# Patient Record
Sex: Male | Born: 1965
Health system: Southern US, Community
[De-identification: ages and names within clinical notes are randomized; demographics above are authoritative.]

## PROBLEM LIST (undated history)

## (undated) DIAGNOSIS — I808 Phlebitis and thrombophlebitis of other sites: Secondary | ICD-10-CM

## (undated) DIAGNOSIS — K529 Noninfective gastroenteritis and colitis, unspecified: Secondary | ICD-10-CM

## (undated) HISTORY — PX: SHOULDER SURGERY: SHX246

## (undated) HISTORY — PX: CHOLECYSTECTOMY: SHX55

---

## 2009-01-19 ENCOUNTER — Ambulatory Visit: Payer: Self-pay | Admitting: Diagnostic Radiology

## 2009-01-19 ENCOUNTER — Emergency Department (HOSPITAL_BASED_OUTPATIENT_CLINIC_OR_DEPARTMENT_OTHER): Admission: EM | Admit: 2009-01-19 | Discharge: 2009-01-19 | Payer: Self-pay | Admitting: Emergency Medicine

## 2010-08-23 LAB — COMPREHENSIVE METABOLIC PANEL
Albumin: 4.7 g/dL (ref 3.5–5.2)
Alkaline Phosphatase: 85 U/L (ref 39–117)
BUN: 15 mg/dL (ref 6–23)
Chloride: 107 mEq/L (ref 96–112)
Creatinine, Ser: 1.3 mg/dL (ref 0.4–1.5)
GFR calc non Af Amer: 60 mL/min — ABNORMAL LOW (ref >60)
Glucose, Bld: 90 mg/dL (ref 70–99)
Potassium: 4.1 mEq/L (ref 3.5–5.1)
Total Bilirubin: 0.3 mg/dL (ref 0.3–1.2)

## 2010-08-23 LAB — URINALYSIS, ROUTINE W REFLEX MICROSCOPIC
Hgb urine dipstick: NEGATIVE
Nitrite: NEGATIVE
Protein, ur: NEGATIVE mg/dL
Specific Gravity, Urine: 1.021 (ref 1.005–1.030)
Urobilinogen, UA: 0.2 mg/dL (ref 0.0–1.0)

## 2010-08-23 LAB — CBC
HCT: 47 % (ref 39.0–52.0)
Hemoglobin: 16.1 g/dL (ref 13.0–17.0)
MCHC: 34.3 g/dL (ref 30.0–36.0)
MCV: 87.1 fL (ref 78.0–100.0)
Platelets: 308 10*3/uL (ref 150–400)
RBC: 5.4 MIL/uL (ref 4.22–5.81)
RDW: 12.6 % (ref 11.5–15.5)
WBC: 9.3 10*3/uL (ref 4.0–10.5)

## 2010-08-23 LAB — DIFFERENTIAL
Basophils Absolute: 0.1 10*3/uL (ref 0.0–0.1)
Basophils Relative: 1 % (ref 0–1)
Lymphocytes Relative: 27 % (ref 12–46)
Neutro Abs: 5.7 10*3/uL (ref 1.7–7.7)
Neutrophils Relative %: 62 % (ref 43–77)

## 2010-08-23 LAB — LIPASE, BLOOD: Lipase: 193 U/L (ref 23–300)

## 2011-08-29 ENCOUNTER — Encounter (HOSPITAL_BASED_OUTPATIENT_CLINIC_OR_DEPARTMENT_OTHER): Payer: Self-pay | Admitting: *Deleted

## 2011-08-29 ENCOUNTER — Emergency Department (HOSPITAL_BASED_OUTPATIENT_CLINIC_OR_DEPARTMENT_OTHER): Payer: Self-pay

## 2011-08-29 ENCOUNTER — Emergency Department (HOSPITAL_BASED_OUTPATIENT_CLINIC_OR_DEPARTMENT_OTHER)
Admission: EM | Admit: 2011-08-29 | Discharge: 2011-08-29 | Disposition: A | Discharge status: Home / Self Care | Payer: Self-pay | Attending: Emergency Medicine | Admitting: Emergency Medicine

## 2011-08-29 ENCOUNTER — Emergency Department (INDEPENDENT_AMBULATORY_CARE_PROVIDER_SITE_OTHER): Payer: Self-pay

## 2011-08-29 DIAGNOSIS — R05 Cough: Secondary | ICD-10-CM

## 2011-08-29 DIAGNOSIS — J45909 Unspecified asthma, uncomplicated: Secondary | ICD-10-CM | POA: Insufficient documentation

## 2011-08-29 DIAGNOSIS — R0602 Shortness of breath: Secondary | ICD-10-CM | POA: Insufficient documentation

## 2011-08-29 DIAGNOSIS — R079 Chest pain, unspecified: Secondary | ICD-10-CM

## 2011-08-29 DIAGNOSIS — J189 Pneumonia, unspecified organism: Secondary | ICD-10-CM | POA: Insufficient documentation

## 2011-08-29 DIAGNOSIS — R9431 Abnormal electrocardiogram [ECG] [EKG]: Secondary | ICD-10-CM | POA: Insufficient documentation

## 2011-08-29 DIAGNOSIS — F17200 Nicotine dependence, unspecified, uncomplicated: Secondary | ICD-10-CM | POA: Insufficient documentation

## 2011-08-29 DIAGNOSIS — R918 Other nonspecific abnormal finding of lung field: Secondary | ICD-10-CM

## 2011-08-29 MED ORDER — ALBUTEROL SULFATE 2 MG PO TABS: 2.0000 mg | ORAL_TABLET | Freq: Three times a day (TID) | ORAL | Status: DC

## 2011-08-29 MED ORDER — BENZONATATE 100 MG PO CAPS
100.0000 mg | ORAL_CAPSULE | Freq: Once | ORAL | Status: AC
  Administered 2011-08-29: 100 mg via ORAL
  Filled 2011-08-29: qty 1

## 2011-08-29 MED ORDER — BENZONATATE 100 MG PO CAPS: 100.0000 mg | ORAL_CAPSULE | Freq: Three times a day (TID) | ORAL | Status: AC

## 2011-08-29 MED ORDER — IPRATROPIUM BROMIDE 0.02 % IN SOLN
RESPIRATORY_TRACT | Status: AC
  Filled 2011-08-29: qty 2.5

## 2011-08-29 MED ORDER — ALBUTEROL SULFATE (5 MG/ML) 0.5% IN NEBU
INHALATION_SOLUTION | RESPIRATORY_TRACT | Status: AC
  Filled 2011-08-29: qty 1

## 2011-08-29 MED ORDER — ALBUTEROL SULFATE HFA 108 (90 BASE) MCG/ACT IN AERS
1.0000 | INHALATION_SPRAY | Freq: Once | RESPIRATORY_TRACT | Status: AC
  Administered 2011-08-29: 2 via RESPIRATORY_TRACT
  Filled 2011-08-29: qty 6.7

## 2011-08-29 MED ORDER — AZITHROMYCIN 250 MG PO TABS: 250.0000 mg | ORAL_TABLET | Freq: Every day | ORAL | Status: AC

## 2011-08-29 MED ORDER — ALBUTEROL SULFATE (5 MG/ML) 0.5% IN NEBU
5.0000 mg | INHALATION_SOLUTION | Freq: Once | RESPIRATORY_TRACT | Status: AC
  Administered 2011-08-29: 5 mg via RESPIRATORY_TRACT

## 2011-08-29 MED ORDER — IPRATROPIUM BROMIDE 0.02 % IN SOLN
0.5000 mg | Freq: Once | RESPIRATORY_TRACT | Status: AC
  Administered 2011-08-29: 0.5 mg via RESPIRATORY_TRACT

## 2011-08-29 NOTE — Discharge Instructions (Signed)
Pneumonia, Adult Pneumonia is an infection of the lungs.  CAUSES Pneumonia may be caused by bacteria or a virus. Usually, these infections are caused by breathing infectious particles into the lungs (respiratory tract). SYMPTOMS   Cough.   Fever.   Chest pain.   Increased rate of breathing.   Wheezing.   Mucus production.  DIAGNOSIS  If you have the common symptoms of pneumonia, your caregiver will typically confirm the diagnosis with a chest X-ray. The X-ray will show an abnormality in the lung (pulmonary infiltrate) if you have pneumonia. Other tests of your blood, urine, or sputum may be done to find the specific cause of your pneumonia. Your caregiver may also do tests (blood gases or pulse oximetry) to see how well your lungs are working. TREATMENT  Some forms of pneumonia may be spread to other people when you cough or sneeze. You may be asked to wear a mask before and during your exam. Pneumonia that is caused by bacteria is treated with antibiotic medicine. Pneumonia that is caused by the influenza virus may be treated with an antiviral medicine. Most other viral infections must run their course. These infections will not respond to antibiotics.  PREVENTION A pneumococcal shot (vaccine) is available to prevent a common bacterial cause of pneumonia. This is usually suggested for:  People over 65 years old.   Patients on chemotherapy.   People with chronic lung problems, such as bronchitis or emphysema.   People with immune system problems.  If you are over 65 or have a high risk condition, you may receive the pneumococcal vaccine if you have not received it before. In some countries, a routine influenza vaccine is also recommended. This vaccine can help prevent some cases of pneumonia.You may be offered the influenza vaccine as part of your care. If you smoke, it is time to quit. You may receive instructions on how to stop smoking. Your caregiver can provide medicines and  counseling to help you quit. HOME CARE INSTRUCTIONS   Cough suppressants may be used if you are losing too much rest. However, coughing protects you by clearing your lungs. You should avoid using cough suppressants if you can.   Your caregiver may have prescribed medicine if he or she thinks your pneumonia is caused by a bacteria or influenza. Finish your medicine even if you start to feel better.   Your caregiver may also prescribe an expectorant. This loosens the mucus to be coughed up.   Only take over-the-counter or prescription medicines for pain, discomfort, or fever as directed by your caregiver.   Do not smoke. Smoking is a common cause of bronchitis and can contribute to pneumonia. If you are a smoker and continue to smoke, your cough may last several weeks after your pneumonia has cleared.   A cold steam vaporizer or humidifier in your room or home may help loosen mucus.   Coughing is often worse at night. Sleeping in a semi-upright position in a recliner or using a couple pillows under your head will help with this.   Get rest as you feel it is needed. Your body will usually let you know when you need to rest.  SEEK IMMEDIATE MEDICAL CARE IF:   Your illness becomes worse. This is especially true if you are elderly or weakened from any other disease.   You cannot control your cough with suppressants and are losing sleep.   You begin coughing up blood.   You develop pain which is getting worse or   is uncontrolled with medicines.   You have a fever.   Any of the symptoms which initially brought you in for treatment are getting worse rather than better.   You develop shortness of breath or chest pain.  MAKE SURE YOU:   Understand these instructions.   Will watch your condition.   Will get help right away if you are not doing well or get worse.  Document Released: 05/05/2005 Document Revised: 04/24/2011 Document Reviewed: 07/25/2010 [facility] Patient Information 2012  [place], [place].  Asthma, Adult Asthma is caused by narrowing of the air passages in the lungs. It may be triggered by pollen, dust, animal dander, molds, some foods, respiratory infections, exposure to smoke, exercise, emotional stress or other allergens (things that cause allergic reactions or allergies). Repeat attacks are common. HOME CARE INSTRUCTIONS   Use prescription medications as ordered by your caregiver.   Avoid pollen, dust, animal dander, molds, smoke and other things that cause attacks at home and at work.   You may have fewer attacks if you decrease dust in your home. Electrostatic air cleaners may help.   It may help to replace your pillows or mattress with materials less likely to cause allergies.   Talk to your caregiver about an action plan for managing asthma attacks at home, including, the use of a peak flow meter which measures the severity of your asthma attack. An action plan can help minimize or stop the attack without having to seek medical care.   If you are not on a fluid restriction, drink 8 to 10 glasses of water each day.   Always have a plan prepared for seeking medical attention, including, calling your physician, accessing local emergency care, and calling 911 (in the [place] for a severe attack.   Discuss possible exercise routines with your caregiver.   If animal dander is the cause of asthma, you may need to get rid of pets.  SEEK MEDICAL CARE IF:   You have wheezing and shortness of breath even if taking medicine to prevent attacks.   You have muscle aches, chest pain or thickening of sputum.   Your sputum changes from clear or white to yellow, green, gray, or bloody.   You have any problems that may be related to the medicine you are taking (such as a rash, itching, swelling or trouble breathing).  SEEK IMMEDIATE MEDICAL CARE IF:   Your usual medicines do not stop your wheezing or there is increased coughing and/or shortness of breath.   You  have increased difficulty breathing.   You have a fever.  MAKE SURE YOU:   Understand these instructions.   Will watch your condition.   Will get help right away if you are not doing well or get worse.  Document Released: 05/05/2005 Document Revised: 04/24/2011 Document Reviewed: 12/22/2007 [facility] Patient Information 2012 [place], [place].  RESOURCE GUIDE  Dental Problems  Patients with Medicaid: [facility] [phone]                                           [phone] [address] Phone:  [phone]  Phone:  [phone]  If unable to pay or uninsured, contact:  [facility] or [facility]. to become qualified for the adult dental clinic.  Chronic Pain Problems Contact [facility]  [phone] Patients need to be referred by their primary care doctor.  Insufficient Money for Medicine Contact [employer] Way:  call "211" or [facility] [phone].  No Primary Care Doctor Call [facility]  [phone] Other agencies that provide inexpensive medical care    [facility]  [phone]    [facility]  [phone]    [facility]  [phone]    [facility]  [phone]    [facility]  [phone]    [facility]  [phone]  Psychological Services [facility]  [phone] [facility]  [phone] [facility]   [phone] (emergency services [phone])  Abuse/Neglect [facility] Child Abuse Hotline [phone] [facility] Child Abuse Hotline 364-635-8427 (After Hours)  Emergency Shelter [facility] [phone]  [facility] [place] [facility] [place] [phone] [facility] [phone]  MRSA Hotline #:   856-567-8992    [facility] Resources  [facility] [place]  [employer]                            [facility] Dept. [address] [place]                     [address]         [address]  Blondell Reveal Phone:  [phone]                                  Phone:  [phone]                   Phone:  [phone]  [facility] Phone:  [phone]  [facility] Child Abuse Hotline 438-123-7431 [phone] (After Hours)

## 2011-08-29 NOTE — ED Provider Notes (Signed)
History     CSN: [id number]  Arrival date & time 08/29/11  1952   First MD Initiated Contact with Patient 08/29/11 2137      Chief Complaint  Patient presents with  . Shortness of Breath    (Consider location/radiation/quality/duration/timing/severity/associated sxs/prior treatment) HPI  46yoM h/o asthma, smoker,  pw shortness of breath. States for 1.5 weeks he's felt more sob than usual, worse 3-4 days ago. Reports fever to 104 at home today. Has been taking motrin 800mg  last dose 630PM. +Cough, mostly non productive. Denies sore throat, nasal congestion, rhinorrhea. States he ran out of his albuterol 9 months ago. Complaining of left chest pain "felt like a spasm when I was coughing" from left chest to clavicle. States it was constant but resolved. Resolved after neb given in the Emergency Department.  Denies h/o VTE in self or family. No recent hosp/surg/immob-- does drive to [place] to/fro once per week for his job. No h/o cancer. Denies exogenous hormone use, no leg pain or swelling.   Denies HTN, HLD, DM, +smoker fmhx- denies h/o CAD  ED Notes, ED Provider Notes from 08/29/11 0000 to 08/29/11 20:00:00       Julieanne Manson, RN 08/29/2011 19:55      Sob, chest pain, fever, for about a week. States he has a hx of asthma and ran out of albuterol 9 months ago. Hyperventilating at triage. Lungs are clear. Very anxious.      Past Medical History  Diagnosis Date  . Asthma     Past Surgical History  Procedure Date  . Cholecystectomy   . Shoulder surgery     No family history on file.  History  Substance Use Topics  . Smoking status: Current Everyday Smoker -- 1.0 packs/day  . Smokeless tobacco: Not on file  . Alcohol Use: No      Review of Systems  All other systems reviewed and are negative.  except as noted HPI   Allergies  Claritin and Penicillins  Home Medications   Current Outpatient Rx  Name Route Sig Dispense Refill  . ALBUTEROL SULFATE HFA 108 (90  BASE) MCG/ACT IN AERS Inhalation Inhale 2 puffs into the lungs every 6 (six) hours as needed. Patient used this medication for shortness of breath.    . TAGAMET PO Oral Take 2 tablets by mouth daily as needed. Patient used this medication for acid reflux.    . IBUPROFEN 200 MG PO TABS Oral Take 800 mg by mouth every 6 (six) hours as needed. Patient used this medication for pain.    . ALBUTEROL SULFATE 2 MG PO TABS Oral Take 1 tablet (2 mg total) by mouth 3 (three) times daily. 90 tablet 0  . AZITHROMYCIN 250 MG PO TABS Oral Take 1 tablet (250 mg total) by mouth daily. Take first 2 tablets together, then 1 every day until finished. 6 tablet 0  . BENZONATATE 100 MG PO CAPS Oral Take 1 capsule (100 mg total) by mouth every 8 (eight) hours. 21 capsule 0    BP 109/65  Pulse 109  Temp(Src) 99.7 F (37.6 C) (Oral)  Resp 18  SpO2 99%  Physical Exam  Nursing note and vitals reviewed. Constitutional: He is oriented to person, place, and time. He appears well-developed and well-nourished. No distress.  HENT:  Head: Atraumatic.  Mouth/Throat: Oropharynx is clear and moist.  Eyes: Conjunctivae are normal. Pupils are equal, round, and reactive to light.  Neck: Neck supple.  Cardiovascular: Normal rate, regular rhythm, normal  heart sounds and intact distal pulses.  Exam reveals no gallop and no friction rub.   No murmur heard. Pulmonary/Chest: Effort normal. No respiratory distress. He has no wheezes. He has no rales.  Abdominal: Soft. Bowel sounds are normal. There is no tenderness. There is no rebound and no guarding.  Musculoskeletal: Normal range of motion. He exhibits no edema and no tenderness.  Neurological: He is alert and oriented to person, place, and time.  Skin: Skin is warm and dry.  Psychiatric: He has a normal mood and affect.    Date: 08/29/2011  Rate: 108  Rhythm: sinus tachycardia  QRS Axis: normal  Intervals: normal  ST/T Wave abnormalities: nonspecific ST changes and  nonspecific T wave changes  Conduction Disutrbances:none  Narrative Interpretation:   Old EKG Reviewed: none available   Date: 08/29/2011  Rate: 82  Rhythm: normal sinus rhythm  QRS Axis: normal  Intervals: normal  ST/T Wave abnormalities: normal  Conduction Disutrbances:none  Narrative Interpretation:   Old EKG Reviewed: none available   ED Course  Procedures (including critical care time)  Labs Reviewed - No data to display Dg Chest 2 View  08/29/2011  *RADIOLOGY REPORT*  Clinical Data: Cough, chest pain and shortness of breath.  CHEST - 2 VIEW  Comparison: 01/19/2009  Findings: The cardiomediastinal silhouette is unremarkable. Left lower lobe consolidation is compatible with pneumonia. There is no evidence of pleural effusion, pneumothorax, nodules/discrete mass or pulmonary edema. No acute bony abnormalities are identified.  IMPRESSION: Left lower lobe consolidation compatible with pneumonia. Radiographic follow up to resolution is recommended.  Original Report Authenticated By: Rosendo Gros, M.D.    1. Community acquired pneumonia   2. Abnormal EKG   3. Asthma     MDM  PW fever, wheezing, shortness of breath. Feeling better after duoneb. Afebrile here (took advil pta). Found to have LLL pneumonia by CXR. Oxygenation 100%RA, no tachypnea. No wheezing after duoneb x one. I have low susp pulm infarct/PE. EKG with t wave inversions inf leads when tachycardic on arrival (since resolved). Resolved with HR control (80s prior to discharge). Discussed extensively with patient. Pain does not sound cardiac in nature. He is not having CP currently and only one RF for CAD (smoker). They are declining labs (suspect 2/2 self pay status). Patient given PMD referrals, albuterol inh in ED, albuterol PO for home, azithromycin. Given strict precautions for return.   HR 80s prior to discharge.    Forbes Cellar, MD 08/30/11 [phone]

## 2011-08-29 NOTE — ED Notes (Signed)
Sob, chest pain, fever, for about a week. States he has a hx of asthma and ran out of albuterol 9 months ago. Hyperventilating at triage. Lungs are clear. Very anxious.

## 2014-03-26 ENCOUNTER — Emergency Department (HOSPITAL_BASED_OUTPATIENT_CLINIC_OR_DEPARTMENT_OTHER)
Admission: EM | Admit: 2014-03-26 | Discharge: 2014-03-26 | Disposition: A | Discharge status: Home / Self Care | Payer: 59 | Attending: Emergency Medicine | Admitting: Emergency Medicine

## 2014-03-26 DIAGNOSIS — H10213 Acute toxic conjunctivitis, bilateral: Secondary | ICD-10-CM | POA: Insufficient documentation

## 2014-03-26 DIAGNOSIS — Z880 Allergy status to penicillin: Secondary | ICD-10-CM | POA: Insufficient documentation

## 2014-03-26 DIAGNOSIS — Z720 Tobacco use: Secondary | ICD-10-CM | POA: Insufficient documentation

## 2014-03-26 DIAGNOSIS — T520X1A Toxic effect of petroleum products, accidental (unintentional), initial encounter: Secondary | ICD-10-CM | POA: Diagnosis not present

## 2014-03-26 DIAGNOSIS — Y9289 Other specified places as the place of occurrence of the external cause: Secondary | ICD-10-CM | POA: Insufficient documentation

## 2014-03-26 DIAGNOSIS — J45909 Unspecified asthma, uncomplicated: Secondary | ICD-10-CM | POA: Insufficient documentation

## 2014-03-26 DIAGNOSIS — H61891 Other specified disorders of right external ear: Secondary | ICD-10-CM | POA: Insufficient documentation

## 2014-03-26 DIAGNOSIS — H5713 Ocular pain, bilateral: Secondary | ICD-10-CM | POA: Diagnosis present

## 2014-03-26 DIAGNOSIS — Y9389 Activity, other specified: Secondary | ICD-10-CM | POA: Insufficient documentation

## 2014-03-26 DIAGNOSIS — H9201 Otalgia, right ear: Secondary | ICD-10-CM | POA: Diagnosis not present

## 2014-03-26 DIAGNOSIS — Z79899 Other long term (current) drug therapy: Secondary | ICD-10-CM | POA: Insufficient documentation

## 2014-03-26 MED ORDER — HYDROCODONE-ACETAMINOPHEN 5-325 MG PO TABS: 2.0000 | ORAL_TABLET | ORAL | Status: DC | PRN

## 2014-03-26 MED ORDER — FLUORESCEIN SODIUM 1 MG OP STRP
1.0000 | ORAL_STRIP | Freq: Once | OPHTHALMIC | Status: AC
  Administered 2014-03-26: 1 via OPHTHALMIC
  Filled 2014-03-26: qty 1

## 2014-03-26 MED ORDER — ERYTHROMYCIN 5 MG/GM OP OINT: TOPICAL_OINTMENT | OPHTHALMIC | Status: DC

## 2014-03-26 MED ORDER — NEOMYCIN-POLYMYXIN-HC 3.5-10000-1 OT SUSP: 4.0000 [drp] | Freq: Four times a day (QID) | OTIC | Status: DC

## 2014-03-26 NOTE — ED Notes (Signed)
Patient working on a car and the [employer] line broke. States that gasoline got into his eyes and ears, can also taste it as well.

## 2014-03-26 NOTE — Discharge Instructions (Signed)
Chemical Conjunctivitis Chemical conjunctivitis is an irritation of the underside of the eyelid and the white part of the eye. Conjunctivitis can be caused by infection, allergy or chemical irritation. In your case it has been caused by a chemical irritation of the eye. Symptoms almost always include: tearing, light sensitivity, gritty feeling (sensation) in the eyes, swelling of your eyelids, and often severe pain. In spite of the severe pain, this irritation will run its course and will improve within 24 hours.  HOME CARE INSTRUCTIONS   To ease discomfort apply a cool, clean wash cloth to your eye for 10 to 20 minutes, 3 to 4 times per day.  Do not rub your eyes.  Gently wipe away any discharge from the eyes with moistened tissues.  Wash your hands often with soap and use paper towels to dry.  Sunglasses may be helpful if light bothers your eyes.  Do not use eye make-up.  Do not use contact lenses until the irritation is gone.  Do not operate machinery or drive if your vision is blurred.  Take medications as directed by your caregiver. Artificial tears may ease discomfort.  Avoid the chemical or surroundings which caused the problem. Always use eye protection as necessary. SEEK MEDICAL CARE IF:   The eye is still pink (inflamed) 3 days after beginning treatment.  Pain in the eye increases.  You have discharge coming from either eye.  Your eyelids are stuck together in the morning.  You have an increased sensitivity to light.  An oral temperature above 102 F (38.9 C) develops.  You develop facial pain.  You have any problems that may be related to the medicine you are taking. SEEK IMMEDIATE MEDICAL CARE IF:   Your vision is getting worse.  You develop severe eye pain. MAKE SURE YOU:   Understand these instructions.  Will watch your condition.  Will get help right away if you are not doing well or get worse. Document Released: 02/12/2005 Document Revised:  07/28/2011 Document Reviewed: 12/22/2007 [employer] Patient Information 2015 [employer]. This information is not intended to replace advice given to you by your health care provider. Make sure you discuss any questions you have with your health care provider.  

## 2014-03-26 NOTE — ED Provider Notes (Signed)
CSN: [id number]     Arrival date & time 03/26/14  1817 History   First MD Initiated Contact with Patient 03/26/14 1820     Chief Complaint  Patient presents with  . Chemical Exposure     (Consider location/radiation/quality/duration/timing/severity/associated sxs/prior Treatment) HPI Comments: Patient presents to the ER for evaluation of gasoline exposure. Patient reports that he was working on a car and the fuel line broke. Patient reports that the gas port down onto his face and dry and his eyes and ears. It did not go in his mouth. Patient reports that he started to have burning and irritation of both of his eyes and the right ear. complaining of moderate pain in the right ear.He washed his face and hair in the shower and then irrigated his ear and eyes with saline prior to coming to the ER. Patient reports that he has a headache, can still smell of gasoline and things that he can taste it. He is concerned that it might have gone through the eustachian tube into his mouth, but did not go directly in the mouth.   Past Medical History  Diagnosis Date  . Asthma    Past Surgical History  Procedure Laterality Date  . Cholecystectomy    . Shoulder surgery     No family history on file. History  Substance Use Topics  . Smoking status: Current Every Day Smoker -- 1.00 packs/day  . Smokeless tobacco: Not on file  . Alcohol Use: No    Review of Systems  HENT: Positive for ear pain.   Eyes: Positive for pain.  Respiratory: Negative for shortness of breath.   All other systems reviewed and are negative.     Allergies  Loratadine and Penicillins  Home Medications   Prior to Admission medications   Medication Sig Start Date End Date Taking? Authorizing Provider  BIOGAIA PROBIOTIC (BIOGAIA PROBIOTIC) LIQD Take by mouth daily at 8 pm.   Yes Historical Provider, MD  albuterol (PROVENTIL HFA) 108 (90 BASE) MCG/ACT inhaler Inhale 2 puffs into the lungs every 6 (six) hours as needed.  Patient used this medication for shortness of breath.    Historical Provider, MD  albuterol (PROVENTIL) 2 MG tablet Take 1 tablet (2 mg total) by mouth 3 (three) times daily. 08/29/11 08/28/12  Forbes CellarLeigh-Ann Webb, MD  Cimetidine (TAGAMET PO) Take 2 tablets by mouth daily as needed. Patient used this medication for acid reflux.    Historical Provider, MD  ibuprofen (ADVIL,MOTRIN) 200 MG tablet Take 800 mg by mouth every 6 (six) hours as needed. Patient used this medication for pain.    Historical Provider, MD   BP 153/102 mmHg  Pulse 90  Temp(Src) 98.6 F (37 C) (Oral)  Resp 20  SpO2 100% Physical Exam  Constitutional: He is oriented to person, place, and time. He appears well-developed and well-nourished. No distress.  HENT:  Head: Normocephalic and atraumatic.  Right Ear: Hearing and tympanic membrane normal. There is swelling (and erythema of canal). No drainage.  Left Ear: Hearing, tympanic membrane and ear canal normal.  Nose: Nose normal.  Mouth/Throat: Oropharynx is clear and moist and mucous membranes are normal.  Eyes: EOM are normal. Pupils are equal, round, and reactive to light. Right conjunctiva is injected. Left conjunctiva is injected.  Mild injection bilaterally, no drainage.  Fluorescein dye with Woods lamp exam reveals normal cornea, no uptake  Neck: Normal range of motion. Neck supple.  Cardiovascular: Regular rhythm, S1 normal and S2 normal.  Exam reveals  no gallop and no friction rub.   No murmur heard. Pulmonary/Chest: Effort normal and breath sounds normal. No respiratory distress. He exhibits no tenderness.  Abdominal: Soft. Normal appearance and bowel sounds are normal. There is no hepatosplenomegaly. There is no tenderness. There is no rebound, no guarding, no tenderness at McBurney's point and negative Murphy's sign. No hernia.  Musculoskeletal: Normal range of motion.  Neurological: He is alert and oriented to person, place, and time. He has normal strength. No  cranial nerve deficit or sensory deficit. Coordination normal. GCS eye subscore is 4. GCS verbal subscore is 5. GCS motor subscore is 6.  Skin: Skin is warm, dry and intact. No rash noted. No cyanosis.  Psychiatric: He has a normal mood and affect. His speech is normal and behavior is normal. Thought content normal.  Nursing note and vitals reviewed.   ED Course  Procedures (including critical care time) Labs Review Labs Reviewed - No data to display  Imaging Review No results found.   EKG Interpretation None      MDM   Final diagnoses:  None  chemical conjunctivitis Chemical irritation right ear canal  Patient presents to the ER for evaluation after having gasoline splash into his eyes and his years. It did not go into his mouth. He reports that he feels like he can taste it currently, but I suspect it is because he strongly smells of gasoline from getting all over him. I do not suspect that he swallowed any. Patient has clear lung fields and no difficulty breathing.  Examination revealed very slight irritation and injection of both eyes. Corneal exam is normal. Left ear exam is normal, right ear exam revealed erythema and swelling of the external canal, no drainage.  Patient will be treated with erythromycin ophthalmic ointment and Cortisporin otic. Vicodin for pain. Return if symptoms worsen.     Gilda Creasehristopher J. Pollina, MD 03/26/14 [phone]

## 2017-08-07 ENCOUNTER — Ambulatory Visit: Payer: Managed Care, Other (non HMO) | Admitting: Family Medicine

## 2017-08-07 ENCOUNTER — Encounter: Payer: Self-pay | Admitting: Family Medicine

## 2017-08-07 VITALS — BP 128/80 | HR 86 | Temp 97.3°F | Ht 68.0 in | Wt 204.5 lb

## 2017-08-07 DIAGNOSIS — Z5181 Encounter for therapeutic drug level monitoring: Secondary | ICD-10-CM | POA: Insufficient documentation

## 2017-08-07 DIAGNOSIS — J301 Allergic rhinitis due to pollen: Secondary | ICD-10-CM | POA: Diagnosis not present

## 2017-08-07 DIAGNOSIS — R195 Other fecal abnormalities: Secondary | ICD-10-CM | POA: Insufficient documentation

## 2017-08-07 DIAGNOSIS — Z23 Encounter for immunization: Secondary | ICD-10-CM

## 2017-08-07 DIAGNOSIS — K219 Gastro-esophageal reflux disease without esophagitis: Secondary | ICD-10-CM | POA: Diagnosis not present

## 2017-08-07 DIAGNOSIS — M25512 Pain in left shoulder: Secondary | ICD-10-CM | POA: Insufficient documentation

## 2017-08-07 DIAGNOSIS — Z0001 Encounter for general adult medical examination with abnormal findings: Secondary | ICD-10-CM

## 2017-08-07 DIAGNOSIS — R41840 Attention and concentration deficit: Secondary | ICD-10-CM

## 2017-08-07 DIAGNOSIS — E875 Hyperkalemia: Secondary | ICD-10-CM | POA: Diagnosis not present

## 2017-08-07 DIAGNOSIS — J4521 Mild intermittent asthma with (acute) exacerbation: Secondary | ICD-10-CM

## 2017-08-07 DIAGNOSIS — Z720 Tobacco use: Secondary | ICD-10-CM | POA: Diagnosis not present

## 2017-08-07 DIAGNOSIS — J45909 Unspecified asthma, uncomplicated: Secondary | ICD-10-CM | POA: Insufficient documentation

## 2017-08-07 LAB — CBC
HCT: 43.2 % (ref 39.0–52.0)
HEMOGLOBIN: 14.3 g/dL (ref 13.0–17.0)
MCHC: 33.1 g/dL (ref 30.0–36.0)
MCV: 85.7 fl (ref 78.0–100.0)
PLATELETS: 304 10*3/uL (ref 150.0–400.0)
RBC: 5.04 Mil/uL (ref 4.22–5.81)
RDW: 14.3 % (ref 11.5–15.5)
WBC: 6.3 10*3/uL (ref 4.0–10.5)

## 2017-08-07 LAB — COMPREHENSIVE METABOLIC PANEL
ALBUMIN: 4.4 g/dL (ref 3.5–5.2)
ALT: 35 U/L (ref 0–53)
AST: 23 U/L (ref 0–37)
Alkaline Phosphatase: 70 U/L (ref 39–117)
BILIRUBIN TOTAL: 0.3 mg/dL (ref 0.2–1.2)
BUN: 16 mg/dL (ref 6–23)
CHLORIDE: 108 meq/L (ref 96–112)
CO2: 25 mEq/L (ref 19–32)
CREATININE: 1.15 mg/dL (ref 0.40–1.50)
Calcium: 9.7 mg/dL (ref 8.4–10.5)
GFR: 70.69 mL/min (ref >60.00)
Glucose, Bld: 93 mg/dL (ref 70–99)
Potassium: 5.5 mEq/L — ABNORMAL HIGH (ref 3.5–5.1)
Sodium: 143 mEq/L (ref 135–145)
Total Protein: 7.4 g/dL (ref 6.0–8.3)

## 2017-08-07 LAB — LIPID PANEL
CHOLESTEROL: 167 mg/dL (ref 0–200)
HDL: 35 mg/dL — ABNORMAL LOW (ref >39.00)
LDL Cholesterol: 111 mg/dL — ABNORMAL HIGH (ref 0–99)
NONHDL: 131.84
Total CHOL/HDL Ratio: 5
Triglycerides: 105 mg/dL (ref 0.0–149.0)
VLDL: 21 mg/dL (ref 0.0–40.0)

## 2017-08-07 LAB — URINALYSIS, ROUTINE W REFLEX MICROSCOPIC
BILIRUBIN URINE: NEGATIVE
Hgb urine dipstick: NEGATIVE
KETONES UR: NEGATIVE
LEUKOCYTES UA: NEGATIVE
Nitrite: NEGATIVE
PH: 5.5 (ref 5.0–8.0)
SPECIFIC GRAVITY, URINE: 1.025 (ref 1.000–1.030)
TOTAL PROTEIN, URINE-UPE24: NEGATIVE
UROBILINOGEN UA: 0.2 (ref 0.0–1.0)
Urine Glucose: NEGATIVE

## 2017-08-07 LAB — TSH: TSH: 2.11 u[IU]/mL (ref 0.35–4.50)

## 2017-08-07 LAB — PSA: PSA: 0.69 ng/mL (ref 0.10–4.00)

## 2017-08-07 MED ORDER — ALBUTEROL SULFATE HFA 108 (90 BASE) MCG/ACT IN AERS: 2.0000 | INHALATION_SPRAY | Freq: Four times a day (QID) | RESPIRATORY_TRACT | 0 refills | Status: DC | PRN

## 2017-08-07 MED ORDER — AMPHETAMINE-DEXTROAMPHET ER 30 MG PO CP24: 30.0000 mg | ORAL_CAPSULE | ORAL | 0 refills | Status: DC

## 2017-08-07 NOTE — Progress Notes (Addendum)
Subjective:  Patient ID: Cameron Peters, male    DOB: 20-Jun-1965  Age: 52 y.o. MRN: [id number]  CC: Establish Care   HPI Cameron Peters presents for a complete physical exam along with multiple medical issues.  He is working in IT and HR and is currently considering a job change.  He has used focus medicine in the past with success intermittently as needed.  He requests a refill of 30 pills for that issue.  He is seeing Dr. love in the past for this.  When he fluid a lot he would take lorazepam only for flying.  He has not had to fly in some time.  He is smoked for over 30 years.  He has a history of asthma and allergy rhinitis.  He is currently using Flonase with success.  He is experiencing some wheezing now.  He has been having left shoulder pain and feels as though it is dislocated.  He has not done so recently but he would like a referral to have it checked.  He has a history of a rotator cuff injury involving his right shoulder.  He does have a history of GERD.  He uses Tums for this.  He does not consume red meat.  He has never had a colonoscopy.  He does not drink alcohol or use illicit drugs.  His parents health history is unknown because he has been estranged with them for some years now.  History Cameron Peters has a past medical history of Asthma.   He has a past surgical history that includes Cholecystectomy and Shoulder surgery.   His family history is not on file.He reports that he has been smoking.  He has been smoking about 1.00 pack per day. He has never used smokeless tobacco. He reports that he does not drink alcohol or use drugs.  Outpatient Medications Prior to Visit  Medication Sig Dispense Refill  . albuterol (PROVENTIL HFA) 108 (90 BASE) MCG/ACT inhaler Inhale 2 puffs into the lungs every 6 (six) hours as needed. Patient used this medication for shortness of breath.    Marland Kitchen albuterol (PROVENTIL) 2 MG tablet Take 1 tablet (2 mg total) by mouth 3 (three) times daily. 90 tablet 0  .  BIOGAIA PROBIOTIC (BIOGAIA PROBIOTIC) LIQD Take by mouth daily at 8 pm.    . Cimetidine (TAGAMET PO) Take 2 tablets by mouth daily as needed. Patient used this medication for acid reflux.    Marland Kitchen erythromycin ophthalmic ointment Place a 1/2 inch ribbon of ointment into the lower eyelid of both eyes 4 times a day for 5 days 1 g 0  . HYDROcodone-acetaminophen (NORCO/VICODIN) 5-325 MG per tablet Take 2 tablets by mouth every 4 (four) hours as needed for moderate pain. 10 tablet 0  . ibuprofen (ADVIL,MOTRIN) 200 MG tablet Take 800 mg by mouth every 6 (six) hours as needed. Patient used this medication for pain.    Marland Kitchen neomycin-polymyxin-hydrocortisone (CORTISPORIN) 3.5-10000-1 otic suspension Place 4 drops into the right ear 4 (four) times daily. X 7 days 10 mL 0   No facility-administered medications prior to visit.     ROS Review of Systems  Constitutional: Negative.   HENT: Positive for postnasal drip and sneezing.   Eyes: Negative for photophobia and visual disturbance.  Respiratory: Positive for cough and wheezing. Negative for shortness of breath.   Cardiovascular: Negative.   Gastrointestinal: Negative.  Negative for anal bleeding and blood in stool.  Endocrine: Negative for cold intolerance and heat intolerance.  Genitourinary: Negative.  Negative for difficulty urinating, frequency and urgency.  Musculoskeletal: Positive for arthralgias.  Skin: Negative for color change and pallor.  Allergic/Immunologic: Negative for immunocompromised state.  Neurological: Negative for weakness and headaches.  Hematological: Does not bruise/bleed easily.  Psychiatric/Behavioral: Positive for decreased concentration.    Objective:  BP 128/80 (BP Location: Left Arm, Patient Position: Sitting, Cuff Size: Normal)   Pulse 86   Temp (!) 97.3 F (36.3 C) (Oral)   Ht 5\' 8"  (1.727 m)   Wt 204 lb 8 oz (92.8 kg)   SpO2 98%   BMI 31.09 kg/m   Physical Exam  Constitutional: He is oriented to person, place,  and time. He appears well-developed and well-nourished. No distress.  HENT:  Head: Normocephalic and atraumatic.  Right Ear: External ear normal.  Left Ear: External ear normal.  Mouth/Throat: Oropharynx is clear and moist. No oropharyngeal exudate.  Eyes: Pupils are equal, round, and reactive to light. Conjunctivae are normal. Right eye exhibits no discharge. Left eye exhibits no discharge. No scleral icterus.  Neck: Neck supple. No JVD present. No tracheal deviation present. No thyromegaly present.  Cardiovascular: Normal rate, regular rhythm and normal heart sounds.  Pulmonary/Chest: Effort normal. No stridor. He has decreased breath sounds. He has wheezes. He has no rhonchi. He has no rales.  Abdominal: Bowel sounds are normal.  Genitourinary: Rectal exam shows guaiac positive stool. Rectal exam shows no external hemorrhoid, no internal hemorrhoid, no fissure, no mass, no tenderness and anal tone normal. Prostate is not enlarged and not tender.  Lymphadenopathy:    He has no cervical adenopathy.  Neurological: He is alert and oriented to person, place, and time.  Skin: Skin is warm and dry. He is not diaphoretic.  Psychiatric: He has a normal mood and affect. His behavior is normal.      Assessment & Plan:   Cameron Peters was seen today for establish care.  Diagnoses and all orders for this visit:  Encounter for health maintenance examination with abnormal findings -     CBC -     Comprehensive metabolic panel -     HIV antibody -     Lipid panel -     PSA -     TSH -     Urinalysis, Routine w reflex microscopic  Attention and concentration deficit -     amphetamine-dextroamphetamine (ADDERALL XR) 30 MG 24 hr capsule; Take 1 capsule (30 mg total) by mouth every morning.  Heme positive stool -     Ambulatory referral to Gastroenterology  Gastroesophageal reflux disease, esophagitis presence not specified -     Ambulatory referral to Gastroenterology  Tobacco abuse -      Cancel: CT CHEST LUNG CA SCREEN LOW DOSE W/O CM; Future -     Ambulatory Referral for Lung Cancer Scre  Non-seasonal allergic rhinitis due to pollen  Mild intermittent reactive airway disease with acute exacerbation -     albuterol (PROVENTIL HFA;VENTOLIN HFA) 108 (90 Base) MCG/ACT inhaler; Inhale 2 puffs into the lungs every 6 (six) hours as needed for wheezing or shortness of breath.  Left shoulder pain, unspecified chronicity -     Ambulatory referral to Sports Medicine  Tobacco use -     Cancel: CT CHEST LUNG CA SCREEN LOW DOSE W/O CM; Future  Need for vaccination with 13-polyvalent pneumococcal conjugate vaccine -     Pneumococcal conjugate vaccine 13-valent  Serum potassium elevated  Hyperkalemia -     Aldosterone; Future   I have  discontinued Cameron NeedleMichael Peters's ibuprofen, Cimetidine (TAGAMET PO), albuterol, albuterol, BIOGAIA PROBIOTIC, erythromycin, neomycin-polymyxin-hydrocortisone, and HYDROcodone-acetaminophen. I am also having him start on amphetamine-dextroamphetamine and albuterol.  Meds ordered this encounter  Medications  . amphetamine-dextroamphetamine (ADDERALL XR) 30 MG 24 hr capsule    Sig: Take 1 capsule (30 mg total) by mouth every morning.    Dispense:  30 capsule    Refill:  0  . albuterol (PROVENTIL HFA;VENTOLIN HFA) 108 (90 Base) MCG/ACT inhaler    Sig: Inhale 2 puffs into the lungs every 6 (six) hours as needed for wheezing or shortness of breath.    Dispense:  1 Inhaler    Refill:  0   Patient has multiple medical issues as outlined above.  He has potassium is mildly elevated with a second draw.  We will ask him if he is taking some type of potassium supplementation.  If not it would be reasonable to check his aldosterone level.  Strongly advised to stop smoking.  Information given about health maintenance.  Follow-up in 3 months.  Follow-up: Return in about 3 months (around 11/07/2017).  Mliss SaxWilliam Alfred Kremer, MD

## 2017-08-08 LAB — HIV ANTIBODY (ROUTINE TESTING W REFLEX): HIV 1&2 Ab, 4th Generation: NONREACTIVE

## 2017-08-10 ENCOUNTER — Other Ambulatory Visit: Payer: Self-pay

## 2017-08-10 DIAGNOSIS — E875 Hyperkalemia: Secondary | ICD-10-CM

## 2017-08-12 ENCOUNTER — Encounter: Payer: Self-pay | Admitting: Family Medicine

## 2017-08-12 ENCOUNTER — Ambulatory Visit (INDEPENDENT_AMBULATORY_CARE_PROVIDER_SITE_OTHER): Payer: Managed Care, Other (non HMO)

## 2017-08-12 ENCOUNTER — Ambulatory Visit: Payer: Managed Care, Other (non HMO) | Admitting: Family Medicine

## 2017-08-12 VITALS — BP 118/82 | HR 95 | Ht 68.0 in

## 2017-08-12 DIAGNOSIS — M25512 Pain in left shoulder: Secondary | ICD-10-CM | POA: Diagnosis not present

## 2017-08-12 DIAGNOSIS — E875 Hyperkalemia: Secondary | ICD-10-CM | POA: Diagnosis not present

## 2017-08-12 LAB — POTASSIUM: Potassium: 5.4 mEq/L — ABNORMAL HIGH (ref 3.5–5.1)

## 2017-08-12 MED ORDER — MELOXICAM 15 MG PO TABS: 15.0000 mg | ORAL_TABLET | Freq: Every day | ORAL | 0 refills | Status: DC

## 2017-08-12 NOTE — Assessment & Plan Note (Signed)
It would suggest on clinical exam and ultrasound that he has a labrum problem. Rotator cuff appears to be intact  - mobic  - counseled on HEP  - xray today  - will follow up if pain continues, can try GH injection. Could consider MRI to evaluate labrum.

## 2017-08-12 NOTE — Progress Notes (Signed)
Cameron Peters - 52 y.o. male MRN [id number]  Date of birth: 01/23/1966  SUBJECTIVE:  Including CC & ROS.  Chief Complaint  Patient presents with  . Shoulder Pain    left x4 weeks ice skating fell backwards    Cameron Peters is a 52 y.o. male that is presenting with left shoulder pain. Pain started 4 weeks ago after he had a fall and had to catch himself. Pain is worse with certain movements. Pain is sharp and worse if he lies on the affected side. Pain is staying the same. He's had shoulder repair of his right shoulder. Some clicking heard. Pain is moderate. Has not tried any home therapies. Denies any swelling.    Review of Systems  Constitutional: Negative for fever.  HENT: Negative for congestion.   Cardiovascular: Negative for chest pain.  Gastrointestinal: Negative for abdominal pain.  Musculoskeletal: Negative for back pain.  Skin: Negative for color change.  Allergic/Immunologic: Negative for immunocompromised state.  Neurological: Negative for weakness.  Hematological: Negative for adenopathy.  Psychiatric/Behavioral: Negative for agitation.    HISTORY: Past Medical, Surgical, Social, and Family History Reviewed & Updated per EMR.   Pertinent Historical Findings include:  Past Medical History:  Diagnosis Date  . Asthma     Past Surgical History:  Procedure Laterality Date  . CHOLECYSTECTOMY    . SHOULDER SURGERY      Allergies  Allergen Reactions  . Penicillins Other (See Comments)    Childhood reaction.    No family history on file.   Social History   Socioeconomic History  . Marital status: Married    Spouse name: Not on file  . Number of children: Not on file  . Years of education: Not on file  . Highest education level: Not on file  Occupational History  . Not on file  Social Needs  . Financial resource strain: Not on file  . Food insecurity:    Worry: Not on file    Inability: Not on file  . Transportation needs:    Medical: Not on file   Non-medical: Not on file  Tobacco Use  . Smoking status: Current Every Day Smoker    Packs/day: 1.00  . Smokeless tobacco: Never Used  Substance and Sexual Activity  . Alcohol use: No  . Drug use: No  . Sexual activity: Not on file  Lifestyle  . Physical activity:    Days per week: Not on file    Minutes per session: Not on file  . Stress: Not on file  Relationships  . Social connections:    Talks on phone: Not on file    Gets together: Not on file    Attends religious service: Not on file    Active member of club or organization: Not on file    Attends meetings of clubs or organizations: Not on file    Relationship status: Not on file  . Intimate partner violence:    Fear of current or ex partner: Not on file    Emotionally abused: Not on file    Physically abused: Not on file    Forced sexual activity: Not on file  Other Topics Concern  . Not on file  Social History Narrative  . Not on file     PHYSICAL EXAM:  VS: BP 118/82   Pulse 95   Ht 5\' 8"  (1.727 m)   SpO2 96%   BMI 31.09 kg/m  Physical Exam Gen: NAD, alert, cooperative with exam, well-appearing ENT: normal  lips, normal nasal mucosa,  Eye: normal EOM, normal conjunctiva and lids CV:  no edema, +2 pedal pulses   Resp: no accessory muscle use, non-labored,  Skin: no rashes, no areas of induration  Neuro: normal tone, normal sensation to touch Psych:  normal insight, alert and oriented MSK:  Left shoulder:  No ecchymosis  Normal active flexion and abduction  Normal ER  Normal strength to resistance with IR and ER  Pain with empty can testing  Normal Hawkin's testing  Pain with O'Brien testing  Neurovascularly intact   Limited ultrasound: left shoulder:  BT with a target sign that tracking down proximally  Normal subscap  Normal supraspinatus  Normal appearing posterior GH joint   Summary: effusion encircling the BT to suggest a joint problem such as a labral tear   Ultrasound and  interpretation by Clare Gandy, MD         ASSESSMENT & PLAN:   Left shoulder pain It would suggest on clinical exam and ultrasound that he has a labrum problem. Rotator cuff appears to be intact  - mobic  - counseled on HEP  - xray today  - will follow up if pain continues, can try GH injection. Could consider MRI to evaluate labrum.

## 2017-08-12 NOTE — Patient Instructions (Signed)
Please take the mobic for 10 day straight and then as needed  You can also try tylenol to help with the pain  Please try to ice the shoulder  Please try the exercises once your pain is less than 2/10 Please follow up with me in 4-6 weeks if your pain hasn't improved.

## 2017-08-13 DIAGNOSIS — Z23 Encounter for immunization: Secondary | ICD-10-CM | POA: Insufficient documentation

## 2017-08-13 DIAGNOSIS — E875 Hyperkalemia: Secondary | ICD-10-CM | POA: Insufficient documentation

## 2017-08-13 NOTE — Patient Instructions (Addendum)
Preventive Care 40-64 Years, Male Preventive care refers to lifestyle choices and visits with your health care provider that can promote health and wellness. What does preventive care include?  A yearly physical exam. This is also called an annual well check.  Dental exams once or twice a year.  Routine eye exams. Ask your health care provider how often you should have your eyes checked.  Personal lifestyle choices, including: ? Daily care of your teeth and gums. ? Regular physical activity. ? Eating a healthy diet. ? Avoiding tobacco and drug use. ? Limiting alcohol use. ? Practicing safe sex. ? Taking low-dose aspirin every day starting at age 39. What happens during an annual well check? The services and screenings done by your health care provider during your annual well check will depend on your age, overall health, lifestyle risk factors, and family history of disease. Counseling Your health care provider may ask you questions about your:  Alcohol use.  Tobacco use.  Drug use.  Emotional well-being.  Home and relationship well-being.  Sexual activity.  Eating habits.  Work and work Statistician.  Screening You may have the following tests or measurements:  Height, weight, and BMI.  Blood pressure.  Lipid and cholesterol levels. These may be checked every 5 years, or more frequently if you are over 76 years old.  Skin check.  Lung cancer screening. You may have this screening every year starting at age 19 if you have a 30-pack-year history of smoking and currently smoke or have quit within the past 15 years.  Fecal occult blood test (FOBT) of the stool. You may have this test every year starting at age 26.  Flexible sigmoidoscopy or colonoscopy. You may have a sigmoidoscopy every 5 years or a colonoscopy every 10 years starting at age 17.  Prostate cancer screening. Recommendations will vary depending on your family history and other risks.  Hepatitis C  blood test.  Hepatitis B blood test.  Sexually transmitted disease (STD) testing.  Diabetes screening. This is done by checking your blood sugar (glucose) after you have not eaten for a while (fasting). You may have this done every 1-3 years.  Discuss your test results, treatment options, and if necessary, the need for more tests with your health care provider. Vaccines Your health care provider may recommend certain vaccines, such as:  Influenza vaccine. This is recommended every year.  Tetanus, diphtheria, and acellular pertussis (Tdap, Td) vaccine. You may need a Td booster every 10 years.  Varicella vaccine. You may need this if you have not been vaccinated.  Zoster vaccine. You may need this after age 79.  Measles, mumps, and rubella (MMR) vaccine. You may need at least one dose of MMR if you were born in 1957 or later. You may also need a second dose.  Pneumococcal 13-valent conjugate (PCV13) vaccine. You may need this if you have certain conditions and have not been vaccinated.  Pneumococcal polysaccharide (PPSV23) vaccine. You may need one or two doses if you smoke cigarettes or if you have certain conditions.  Meningococcal vaccine. You may need this if you have certain conditions.  Hepatitis A vaccine. You may need this if you have certain conditions or if you travel or work in places where you may be exposed to hepatitis A.  Hepatitis B vaccine. You may need this if you have certain conditions or if you travel or work in places where you may be exposed to hepatitis B.  Haemophilus influenzae type b (Hib) vaccine.  You may need this if you have certain risk factors.  Talk to your health care provider about which screenings and vaccines you need and how often you need them. This information is not intended to replace advice given to you by your health care provider. Make sure you discuss any questions you have with your health care provider. Document Released: 06/01/2015  Document Revised: 01/23/2016 Document Reviewed: 03/06/2015 Elsevier Interactive Patient Education  2018 Elsevier Inc[employer]  Health Maintenance, Male A healthy lifestyle and preventive care is important for your health and wellness. Ask your health care provider about what schedule of regular examinations is right for you. What should I know about weight and diet? Eat a Healthy Diet  Eat plenty of vegetables, fruits, whole grains, low-fat dairy products, and lean protein.  Do not eat a lot of foods high in solid fats, added sugars, or salt.  Maintain a Healthy Weight Regular exercise can help you achieve or maintain a healthy weight. You should:  Do at least 150 minutes of exercise each week. The exercise should increase your heart rate and make you sweat (moderate-intensity exercise).  Do strength-training exercises at least twice a week.  Watch Your Levels of Cholesterol and Blood Lipids  Have your blood tested for lipids and cholesterol every 5 years starting at 53 years of age. If you are at high risk for heart disease, you should start having your blood tested when you are 53 years old. You may need to have your cholesterol levels checked more often if: ? Your lipid or cholesterol levels are high. ? You are older than 53 years of age. ? You are at high risk for heart disease.  What should I know about cancer screening? Many types of cancers can be detected early and may often be prevented. Lung Cancer  You should be screened every year for lung cancer if: ? You are a current smoker who has smoked for at least 30 years. ? You are a former smoker who has quit within the past 15 years.  Talk to your health care provider about your screening options, when you should start screening, and how often you should be screened.  Colorectal Cancer  Routine colorectal cancer screening usually begins at 53 years of age and should be repeated every 5-10 years until you are 53 years old. You may  need to be screened more often if early forms of precancerous polyps or small growths are found. Your health care provider may recommend screening at an earlier age if you have risk factors for colon cancer.  Your health care provider may recommend using home test kits to check for hidden blood in the stool.  A small camera at the end of a tube can be used to examine your colon (sigmoidoscopy or colonoscopy). This checks for the earliest forms of colorectal cancer.  Prostate and Testicular Cancer  Depending on your age and overall health, your health care provider may do certain tests to screen for prostate and testicular cancer.  Talk to your health care provider about any symptoms or concerns you have about testicular or prostate cancer.  Skin Cancer  Check your skin from head to toe regularly.  Tell your health care provider about any new moles or changes in moles, especially if: ? There is a change in a mole's size, shape, or color. ? You have a mole that is larger than a pencil eraser.  Always use sunscreen. Apply sunscreen liberally and repeat throughout the day.    Protect yourself by wearing long sleeves, pants, a wide-brimmed hat, and sunglasses when outside.  What should I know about heart disease, diabetes, and high blood pressure?  If you are 18-39 years of age, have your blood pressure checked every 3-5 years. If you are 40 years of age or older, have your blood pressure checked every year. You should have your blood pressure measured twice-once when you are at a hospital or clinic, and once when you are not at a hospital or clinic. Record the average of the two measurements. To check your blood pressure when you are not at a hospital or clinic, you can use: ? An automated blood pressure machine at a pharmacy. ? A home blood pressure monitor.  Talk to your health care provider about your target blood pressure.  If you are between 45-79 years old, ask your health care  provider if you should take aspirin to prevent heart disease.  Have regular diabetes screenings by checking your fasting blood sugar level. ? If you are at a normal weight and have a low risk for diabetes, have this test once every three years after the age of 45. ? If you are overweight and have a high risk for diabetes, consider being tested at a younger age or more often.  A one-time screening for abdominal aortic aneurysm (AAA) by ultrasound is recommended for men aged 65-75 years who are current or former smokers. What should I know about preventing infection? Hepatitis B If you have a higher risk for hepatitis B, you should be screened for this virus. Talk with your health care provider to find out if you are at risk for hepatitis B infection. Hepatitis C Blood testing is recommended for:  Everyone born from 1945 through 1965.  Anyone with known risk factors for hepatitis C.  Sexually Transmitted Diseases (STDs)  You should be screened each year for STDs including gonorrhea and chlamydia if: ? You are sexually active and are younger than 53 years of age. ? You are older than 53 years of age and your health care provider tells you that you are at risk for this type of infection. ? Your sexual activity has changed since you were last screened and you are at an increased risk for chlamydia or gonorrhea. Ask your health care provider if you are at risk.  Talk with your health care provider about whether you are at high risk of being infected with HIV. Your health care provider may recommend a prescription medicine to help prevent HIV infection.  What else can I do?  Schedule regular health, dental, and eye exams.  Stay current with your vaccines (immunizations).  Do not use any tobacco products, such as cigarettes, chewing tobacco, and e-cigarettes. If you need help quitting, ask your health care provider.  Limit alcohol intake to no more than 2 drinks per day. One drink equals 12  ounces of beer, 5 ounces of wine, or 1 ounces of hard liquor.  Do not use street drugs.  Do not share needles.  Ask your health care provider for help if you need support or information about quitting drugs.  Tell your health care provider if you often feel depressed.  Tell your health care provider if you have ever been abused or do not feel safe at home. This information is not intended to replace advice given to you by your health care provider. Make sure you discuss any questions you have with your health care provider. Document Released: 11/01/2007 Document   Revised: 01/02/2016 Document Reviewed: 02/06/2015 Elsevier Interactive Patient Education  2018 [place] with Quitting Smoking Quitting smoking is a physical and mental challenge. You will face cravings, withdrawal symptoms, and temptation. Before quitting, work with your health care provider to make a plan that can help you cope. Preparation can help you quit and keep you from giving in. How can I cope with cravings? Cravings usually last for 5-10 minutes. If you get through it, the craving will pass. Consider taking the following actions to help you cope with cravings:  Keep your mouth busy: ? Chew sugar-free gum. ? Suck on hard candies or a straw. ? Brush your teeth.  Keep your hands and body busy: ? Immediately change to a different activity when you feel a craving. ? Squeeze or play with a ball. ? Do an activity or a hobby, like making bead jewelry, practicing needlepoint, or working with wood. ? Mix up your normal routine. ? Take a short exercise break. Go for a quick walk or run up and down stairs. ? Spend time in public places where smoking is not allowed.  Focus on doing something kind or helpful for someone else.  Call a friend or family member to talk during a craving.  Join a support group.  Call a quit line, such as 1-800-QUIT-NOW.  Talk with your health care provider about medicines that  might help you cope with cravings and make quitting easier for you.  How can I deal with withdrawal symptoms? Your body may experience negative effects as it tries to get used to not having nicotine in the system. These effects are called withdrawal symptoms. They may include:  Feeling hungrier than normal.  Trouble concentrating.  Irritability.  Trouble sleeping.  Feeling depressed.  Restlessness and agitation.  Craving a cigarette.  To manage withdrawal symptoms:  Avoid places, people, and activities that trigger your cravings.  Remember why you want to quit.  Get plenty of sleep.  Avoid coffee and other caffeinated drinks. These may worsen some of your symptoms.  How can I handle social situations? Social situations can be difficult when you are quitting smoking, especially in the first few weeks. To manage this, you can:  Avoid parties, bars, and other social situations where people might be smoking.  Avoid alcohol.  Leave right away if you have the urge to smoke.  Explain to your family and friends that you are quitting smoking. Ask for understanding and support.  Plan activities with friends or family where smoking is not an option.  What are some ways I can cope with stress? Wanting to smoke may cause stress, and stress can make you want to smoke. Find ways to manage your stress. Relaxation techniques can help. For example:  Breathe slowly and deeply, in through your nose and out through your mouth.  Listen to soothing, relaxing music.  Talk with a family member or friend about your stress.  Light a candle.  Soak in a bath or take a shower.  Think about a peaceful place.  What are some ways I can prevent weight gain? Be aware that many people gain weight after they quit smoking. However, not everyone does. To keep from gaining weight, have a plan in place before you quit and stick to the plan after you quit. Your plan should include:  Having healthy  snacks. When you have a craving, it may help to: ? Eat plain popcorn, crunchy carrots, celery, or other cut vegetables. ? Chew  sugar-free gum.  Changing how you eat: ? Eat small portion sizes at meals. ? Eat 4-6 small meals throughout the day instead of 1-2 large meals a day. ? Be mindful when you eat. Do not watch television or do other things that might distract you as you eat.  Exercising regularly: ? Make time to exercise each day. If you do not have time for a long workout, do short bouts of exercise for 5-10 minutes several times a day. ? Do some form of strengthening exercise, like weight lifting, and some form of aerobic exercise, like running or swimming.  Drinking plenty of water or other low-calorie or no-calorie drinks. Drink 6-8 glasses of water daily, or as much as instructed by your health care provider.  Summary  Quitting smoking is a physical and mental challenge. You will face cravings, withdrawal symptoms, and temptation to smoke again. Preparation can help you as you go through these challenges.  You can cope with cravings by keeping your mouth busy (such as by chewing gum), keeping your body and hands busy, and making calls to family, friends, or a helpline for people who want to quit smoking.  You can cope with withdrawal symptoms by avoiding places where people smoke, avoiding drinks with caffeine, and getting plenty of rest.  Ask your health care provider about the different ways to prevent weight gain, avoid stress, and handle social situations. This information is not intended to replace advice given to you by your health care provider. Make sure you discuss any questions you have with your health care provider. Document Released: 05/02/2016 Document Revised: 05/02/2016 Document Reviewed: 05/02/2016 Elsevier Interactive Patient Education  [employer].

## 2017-08-14 NOTE — Addendum Note (Signed)
Addended by: Andrez GrimeKREMER, WILLIAM A on: 08/14/2017 04:03 PM   Modules accepted: Orders

## 2017-08-18 ENCOUNTER — Other Ambulatory Visit: Payer: Self-pay

## 2017-08-18 DIAGNOSIS — E875 Hyperkalemia: Secondary | ICD-10-CM

## 2017-08-27 ENCOUNTER — Other Ambulatory Visit: Payer: Self-pay | Admitting: Family Medicine

## 2017-08-27 DIAGNOSIS — J4521 Mild intermittent asthma with (acute) exacerbation: Secondary | ICD-10-CM

## 2017-09-07 ENCOUNTER — Telehealth: Payer: Self-pay | Admitting: Family Medicine

## 2017-09-07 NOTE — Telephone Encounter (Signed)
Copied from CRM [id number]. Topic: Quick Communication - Rx Refill/Question >> Sep 07, 2017  2:24 PM Cipriano BunkerLambe, Annette S wrote:  Medication: amphetamine-dextroamphetamine (ADDERALL XR) 30 MG 24 hr capsule  Has the patient contacted their pharmacy? No.  (Agent: If no, request that the patient contact the pharmacy for the refill.) Preferred Pharmacy (with phone number or street name):   [id number] - [place], [place] - [address] [place] [id number] Phone: [phone] Fax: [phone]   Agent: Please be advised that RX refills may take up to 3 business days. We ask that you follow-up with your pharmacy.

## 2017-09-08 ENCOUNTER — Other Ambulatory Visit: Payer: Self-pay | Admitting: Family Medicine

## 2017-09-08 DIAGNOSIS — R41840 Attention and concentration deficit: Secondary | ICD-10-CM

## 2017-09-08 MED ORDER — AMPHETAMINE-DEXTROAMPHET ER 30 MG PO CP24: 30.0000 mg | ORAL_CAPSULE | ORAL | 0 refills | Status: DC

## 2017-09-08 NOTE — Telephone Encounter (Signed)
pts mother called to f/u on RX. She said that he needs this for work. Pt is out and cannot go without for a week until Dr. Doreene BurkeKremer returns. Can another provider send it in? Please advise.  [facility] [id number] - [place], [place] - [address] [place] [id number] Phone: [phone] Fax: [phone]

## 2017-09-08 NOTE — Telephone Encounter (Signed)
Please advise if this can be filled in Dr. Evangeline GulaKremer's absence.

## 2017-09-08 NOTE — Telephone Encounter (Signed)
Patient is aware that Rx is at the pharmacy for him.

## 2017-09-08 NOTE — Telephone Encounter (Signed)
Chart reviewed, Refilled x30 days.

## 2017-10-07 ENCOUNTER — Other Ambulatory Visit: Payer: Self-pay

## 2017-10-07 ENCOUNTER — Telehealth: Payer: Self-pay | Admitting: Family Medicine

## 2017-10-07 ENCOUNTER — Other Ambulatory Visit: Payer: Self-pay | Admitting: *Deleted

## 2017-10-07 DIAGNOSIS — J4521 Mild intermittent asthma with (acute) exacerbation: Secondary | ICD-10-CM

## 2017-10-07 DIAGNOSIS — R41840 Attention and concentration deficit: Secondary | ICD-10-CM

## 2017-10-07 MED ORDER — ALBUTEROL SULFATE HFA 108 (90 BASE) MCG/ACT IN AERS: INHALATION_SPRAY | RESPIRATORY_TRACT | 1 refills | Status: DC

## 2017-10-07 MED ORDER — AMPHETAMINE-DEXTROAMPHET ER 30 MG PO CP24: 30.0000 mg | ORAL_CAPSULE | ORAL | 0 refills | Status: DC | PRN

## 2017-10-07 NOTE — Telephone Encounter (Signed)
Rx Adderall fill by office. Rx of ventolin refilled per protocol- LOV: 08/07/17

## 2017-10-07 NOTE — Telephone Encounter (Signed)
Copied from CRM [id number]. Topic: Quick Communication - See Telephone Encounter >> Oct 07, 2017 10:25 AM Trula Slade wrote: CRM for notification. See Telephone encounter for: 10/07/17. Patient would like a refill on his amphetamine-dextroamphetamine (ADDERALL XR) 30 MG 24 hr capsule and his VENTOLIN HFA 108 (90 Base) MCG/ACT inhaler medications.  Please send it to his preferred pharmacy [facility] on [address] in [place].

## 2017-11-04 ENCOUNTER — Other Ambulatory Visit: Payer: Self-pay | Admitting: Family Medicine

## 2017-11-04 DIAGNOSIS — R41840 Attention and concentration deficit: Secondary | ICD-10-CM

## 2017-11-04 NOTE — Telephone Encounter (Signed)
Copied from CRM [id number]. Topic: Quick Communication - Rx Refill/Question >> Nov 04, 2017 12:46 PM Cameron Peters, Cameron G wrote: amphetamine-dextroamphetamine (ADDERALL XR) 30 MG 24 hr capsule  Wanting pick up hard copy of Rx at office.

## 2017-11-05 ENCOUNTER — Other Ambulatory Visit: Payer: Self-pay | Admitting: Family Medicine

## 2017-11-05 NOTE — Telephone Encounter (Signed)
OLV  08/07/17 Dr. Doreene BurkeKremer Last refill 10/07/17  # 30 with 0 refills Requests to pick up rx at office.

## 2017-11-05 NOTE — Telephone Encounter (Signed)
Patient wants to Pick the Rx up, please print instead of sending it electronically.

## 2017-11-06 ENCOUNTER — Telehealth: Payer: Self-pay | Admitting: Family Medicine

## 2017-11-06 MED ORDER — AMPHETAMINE-DEXTROAMPHET ER 30 MG PO CP24: 30.0000 mg | ORAL_CAPSULE | ORAL | 0 refills | Status: DC | PRN

## 2017-11-06 NOTE — Telephone Encounter (Signed)
Copied from CRM [id number]. Topic: Inquiry >> Nov 06, 2017 12:55 PM Yvonna Alanisobinson, Andra M wrote: Reason for CRM: Patient's wife called requesting a refill of Amphetamine-dextroamphetamine (ADDERALL XR) 30 MG 24 hr capsule. Patient has contact her pharmacy. Patient is completely out of medication. [id number] [place] - [address] [phone] (Phone)  [phone] (Fax).        Thank You!!!

## 2017-11-06 NOTE — Telephone Encounter (Signed)
Medication filed on 11/06/17

## 2017-11-20 ENCOUNTER — Other Ambulatory Visit: Payer: Self-pay | Admitting: Family Medicine

## 2017-11-20 DIAGNOSIS — R41840 Attention and concentration deficit: Secondary | ICD-10-CM

## 2017-11-20 NOTE — Telephone Encounter (Signed)
Copied from CRM [id number]. Topic: Quick Communication - See Telephone Encounter >> Nov 20, 2017  1:23 PM Waymon AmatoBurton, Donna F wrote: Pt is needing a refill on adderall   [facility] [address]   Best number [phone]

## 2017-11-20 NOTE — Telephone Encounter (Signed)
Adderall refill Last Refill: 11/06/17 #15 Last OV: 08/07/17 PCP: Dr. Doreene BurkeKremer [facility] [address]

## 2017-11-23 MED ORDER — AMPHETAMINE-DEXTROAMPHET ER 30 MG PO CP24: 30.0000 mg | ORAL_CAPSULE | ORAL | 0 refills | Status: DC | PRN

## 2017-12-22 ENCOUNTER — Other Ambulatory Visit: Payer: Self-pay | Admitting: Family Medicine

## 2017-12-22 DIAGNOSIS — R41840 Attention and concentration deficit: Secondary | ICD-10-CM

## 2017-12-22 NOTE — Telephone Encounter (Signed)
Adderall refill Last Refill:11/23/17 #30 Last OV: 08/07/17 PCP: Dr. Doreene BurkeKremer [facility]  [place]   [id number]

## 2017-12-22 NOTE — Telephone Encounter (Signed)
Dr. Doreene BurkeKremer please advise per refill request below. Thanks.

## 2017-12-22 NOTE — Telephone Encounter (Signed)
Copied from CRM [id number]. Topic: Quick Communication - Rx Refill/Question >> Dec 22, 2017 11:16 AM Mcneil, Ja-Kwan wrote: Medication: amphetamine-dextroamphetamine (ADDERALL XR) 30 MG 24 hr capsule  Has the patient contacted their pharmacy? yes   Preferred Pharmacy (with phone number or street name): [id number] - [place], [place] - [address] [phone] (Phone) [phone] (Fax)   Agent: Please be advised that RX refills may take up to 3 business days. We ask that you follow-up with your pharmacy.

## 2017-12-23 MED ORDER — AMPHETAMINE-DEXTROAMPHET ER 30 MG PO CP24: 30.0000 mg | ORAL_CAPSULE | ORAL | 0 refills | Status: DC | PRN

## 2017-12-23 NOTE — Telephone Encounter (Signed)
Left v/m to inform patient that the below refill request has been sent to his pharmacy today.

## 2018-01-20 ENCOUNTER — Other Ambulatory Visit: Payer: Self-pay | Admitting: Family Medicine

## 2018-01-20 DIAGNOSIS — R41840 Attention and concentration deficit: Secondary | ICD-10-CM

## 2018-01-20 NOTE — Telephone Encounter (Signed)
Refill of adderall  LOV 08/12/17 Dr. Jordan Likes  LOV with PCP Dr. Doreene Burke 08/07/17  LRF 12/23/17  #30  0 refills  [id number] - [place], [place] - [address]           [phone] (Phone) [phone] (Fax)

## 2018-01-20 NOTE — Telephone Encounter (Signed)
Copied from CRM [id number]. Topic: Quick Communication - Rx Refill/Question >> Jan 20, 2018  3:17 PM Arlyss Gandy, NT wrote: Medication: amphetamine-dextroamphetamine (ADDERALL XR) 30 MG 24 hr capsule   Has the patient contacted their pharmacy? Yes.   (Agent: If no, request that the patient contact the pharmacy for the refill.) (Agent: If yes, when and what did the pharmacy advise?)  Preferred Pharmacy (with phone number or street name): [id number] - [place], [place] - [address] [phone] (Phone) [phone] (Fax)    Agent: Please be advised that RX refills may take up to 3 business days. We ask that you follow-up with your pharmacy.

## 2018-01-21 NOTE — Telephone Encounter (Signed)
Needs to rtc for follow up prior to refills.

## 2018-01-22 ENCOUNTER — Ambulatory Visit (INDEPENDENT_AMBULATORY_CARE_PROVIDER_SITE_OTHER): Payer: Managed Care, Other (non HMO) | Admitting: Family Medicine

## 2018-01-22 ENCOUNTER — Encounter: Payer: Self-pay | Admitting: Family Medicine

## 2018-01-22 DIAGNOSIS — R41840 Attention and concentration deficit: Secondary | ICD-10-CM

## 2018-01-22 MED ORDER — AMPHETAMINE-DEXTROAMPHET ER 30 MG PO CP24: 30.0000 mg | ORAL_CAPSULE | ORAL | 0 refills | Status: DC | PRN

## 2018-01-22 NOTE — Patient Instructions (Signed)
Attention Deficit Hyperactivity Disorder, Pediatric Attention deficit hyperactivity disorder (ADHD) is a condition that can make it hard for a child to pay attention and concentrate or to control his or her behavior. The child may also have a lot of energy. ADHD is a disorder of the brain (neurodevelopmental disorder), and symptoms are typically first seen in early childhood. It is a common reason for behavioral and academic problems in school. There are three main types of ADHD:  Inattentive. With this type, children have difficulty paying attention.  Hyperactive-impulsive. With this type, children have a lot of energy and have difficulty controlling their behavior.  Combination. This type involves having symptoms of both of the other types.  ADHD is a lifelong condition. If it is not treated, the disorder can affect a child's future academic achievement, employment, and relationships. What are the causes? The exact cause of this condition is not known. What increases the risk? This condition is more likely to develop in:  Children who have a first-degree relative, such as a parent or brother or sister, with the condition.  Children who had a low birth weight.  Children whose mothers had problems during pregnancy or used alcohol or tobacco during pregnancy.  Children who have had a brain infection or a head injury.  Children who have been exposed to lead.  What are the signs or symptoms? Symptoms of this condition depend on the type of ADHD. Symptoms are listed here for each type: Inattentive  Problems with organization.  Difficulty staying focused.  Problems completing assignments at school.  Often making simple mistakes.  Problems sustaining mental effort.  Not listening to instructions.  Losing things often.  Forgetting things often.  Being easily distracted. Hyperactive-impulsive  Fidgeting often.  Difficulty sitting still in one's seat.  Talking a  lot.  Talking out of turn.  Interrupting others.  Difficulty relaxing or doing quiet activities.  High energy levels and constant movement.  Difficulty waiting.  Always "on the go." Combination  Having symptoms of both of the other types. Children with ADHD may feel frustrated with themselves and may find school to be particularly discouraging. They often perform below their abilities in school. As children get older, the excess movement can lessen, but the problems with paying attention and staying organized often continue. Most children do not outgrow ADHD, but with good treatment, they can learn to cope with the symptoms. How is this diagnosed? This condition is diagnosed based on a child's symptoms and academic history. The child's health care provider will do a complete assessment. As part of the assessment, the health care provider will ask the child questions and will ask the parents and teachers for their observations of the child. The health care provider looks for specific symptoms of ADHD. Diagnosis will include:  Ruling out other reasons for the child's behavior.  Reviewing behavior rating scales that have been filled out about the child by people who deal with the child on a daily basis.  A diagnosis is made only after all information from multiple people has been considered. How is this treated? Treatment for this condition may include:  Behavior therapy.  Medicines to decrease impulsivity and hyperactivity and to increase attention. Behavior therapy is preferred for children younger than 6 years old. The combination of medicine and behavior therapy is most effective for children older than 6 years of age.  Tutoring or extra support at school.  Techniques for parents to use at home to help manage their child's symptoms   and behavior.  Follow these instructions at home: Eating and drinking  Offer your child a well-balanced diet. Breakfast that includes a balance  of whole grains, protein, and fruits or vegetables is especially important for school performance.  If your child has trouble with hyperactivity, have your child avoid drinks that contain caffeine. These include: ? Soft drinks. ? Coffee. ? Tea.  If your child is older and finds that caffeinated drinks help to improve his or her attention, talk with your child's health care provider about what amount of caffeine intake is a safe for your child. Lifestyle   Make sure your child gets a full night of sleep and regular daily exercise.  Help manage your child's behavior by following the techniques learned in therapy. These may include: ? Looking for good behavior and rewarding it. ? Making rules for behavior that your child can understand and follow. ? Giving clear instructions. ? Responding consistently to your child's challenging behaviors. ? Setting realistic goals. ? Looking for activities that can lead to success and self-esteem. ? Making time for pleasant activities with your child. ? Giving lots of affection.  Help your child learn to be organized. Some ways to do this include: ? Keeping daily schedules the same. Have a regular wake-up time and bedtime for your child. Schedule all activities, including time for homework and time for play. Post the schedule in a place where your child will see it. Mark schedule changes in advance. ? Having a regular place for your child to store items such as clothing, backpacks, and school supplies. ? Encouraging your child to write down school assignments and to bring home needed books. Work with your child's teachers for assistance in organizing school work. General instructions  Learn as much as you can about ADHD. This will improve your ability to help your child and to make sure he or she gets the support needed. It will also help you educate your child's teachers and instructors if they do not feel that they have adequate knowledge or experience  in these areas.  Work with your child's teachers to make sure your child gets the support and extra help that is needed. This may include: ? Tutoring. ? Teacher cues to help your child remain on task. ? Seating changes so your child is working at a desk that is free from distractions.  Give over-the-counter and prescription medicines only as told by your child's health care provider.  Keep all follow-up visits as told by your health care provider. This is important. Contact a health care provider if:  Your child has repeated muscle twitches (tics), coughs, or speech outbursts.  Your child has sleep problems.  Your child has a marked loss of appetite.  Your child develops depression.  Your child has new or worsening behavioral problems.  Your child has dizziness.  Your child has a racing heart.  Your child has stomach pains.  Your child develops headaches. Get help right away if:  Your child talks about or threatens suicide.  You are worried that your child is having a bad reaction to a medicine that he or she is taking for ADHD. This information is not intended to replace advice given to you by your health care provider. Make sure you discuss any questions you have with your health care provider. Document Released: 04/25/2002 Document Revised: 01/02/2016 Document Reviewed: 11/29/2015 Elsevier Interactive Patient Education  2018 [employer]. Amphetamine; Dextroamphetamine extended-release capsules What is this medicine? AMPHETAMINE; DEXTROAMPHETAMINE (am FET a  meen; dex troe am FET a meen) is used to treat attention-deficit hyperactivity disorder (ADHD). Federal law prohibits giving this medicine to any person other than the person for whom it was prescribed. Do not share this medicine with anyone else. This medicine may be used for other purposes; ask your health care provider or pharmacist if you have questions. COMMON BRAND NAME(S): Adderall XR, Mydayis What should I  tell my health care provider before I take this medicine? They need to know if you have any of these conditions: -anxiety or panic attacks -circulation problems in fingers and toes -glaucoma -hardening or blockages of the arteries or heart blood vessels -heart disease or a heart defect -high blood pressure -history of a drug or alcohol abuse problem -history of stroke -kidney disease -liver disease -mental illness -seizures -suicidal thoughts, plans, or attempt; a previous suicide attempt by you or a family member -thyroid disease -Tourette's syndrome -an unusual or allergic reaction to dextroamphetamine, other amphetamines, other medicines, foods, dyes, or preservatives -pregnant or trying to get pregnant -breast-feeding How should I use this medicine? Take this medicine by mouth with a glass of water. Follow the directions on the prescription label. This medicine is taken just one time per day, usually in the morning after waking up. Take with or without food. Do not chew or crush this medicine. You may open the capsules and sprinkle the medicine on a spoonful of applesauce. If sprinkled on applesauce, take the dose immediately and do not crush or chew. Always drink a glass of water or other liquid after taking this medicine. Do not take your medicine more often than directed. A special MedGuide will be given to you by the pharmacist with each prescription and refill. Be sure to read this information carefully each time. Talk to your pediatrician regarding the use of this medicine in children. While this drug may be prescribed for children as young as 6 years for selected conditions, precautions do apply. Overdosage: If you think you have taken too much of this medicine contact a poison control center or emergency room at once. NOTE: This medicine is only for you. Do not share this medicine with others. What if I miss a dose? If you miss a dose, take it as soon as you can. If it is  almost time for your next dose, take only that dose. Do not take double or extra doses. What may interact with this medicine? Do not take this medicine with any of the following medications: -MAOIs like Carbex, Eldepryl, Marplan, Nardil, and Parnate -other stimulant medicines for attention disorders, weight loss, or to stay awake This medicine may also interact with the following medications: -acetazolamide -ammonium chloride -antacids -ascorbic acid -atomoxetine -caffeine -certain medicines for blood pressure -certain medicines for depression, anxiety, or psychotic disturbances -certain medicines for diabetes -certain medicines for seizures like carbamazepine, phenobarbital, phenytoin -certain medicines for stomach problems like cimetidine, famotidine, omeprazole, lansoprazole -cold or allergy medicines -glutamic acid -lithium -meperidine -methenamine; sodium acid phosphate -narcotic medicines for pain -norepinephrine -phenothiazines like chlorpromazine, mesoridazine, prochlorperazine, thioridazine -sodium bicarbonate This list may not describe all possible interactions. Give your health care provider a list of all the medicines, herbs, non-prescription drugs, or dietary supplements you use. Also tell them if you smoke, drink alcohol, or use illegal drugs. Some items may interact with your medicine. What should I watch for while using this medicine? Visit your doctor or health care professional for regular checks on your progress. This prescription requires that you follow  special procedures with your doctor and pharmacy. You will need to have a new written prescription from your doctor every time you need a refill. This medicine may affect your concentration, or hide signs of tiredness. Until you know how this medicine affects you, do not drive, ride a bicycle, use machinery, or do anything that needs mental alertness. Tell your doctor or health care professional if this medicine  loses its effects, or if you feel you need to take more than the prescribed amount. Do not change the dosage without talking to your doctor or health care professional. Decreased appetite is a common side effect when starting this medicine. Eating small, frequent meals or snacks can help. Talk to your doctor if you continue to have poor eating habits. Height and weight growth of a child taking this medicine will be monitored closely. Do not take this medicine close to bedtime. It may prevent you from sleeping. If you are going to need surgery, an MRI, a CT scan, or other procedure, tell your doctor that you are taking this medicine. You may need to stop taking this medicine before the procedure. Tell your doctor or healthcare professional right away if you notice unexplained wounds on your fingers and toes while taking this medicine. You should also tell your healthcare provider if you experience numbness or pain, changes in the skin color, or sensitivity to temperature in your fingers or toes. What side effects may I notice from receiving this medicine? Side effects that you should report to your doctor or health care professional as soon as possible: -allergic reactions like skin rash, itching or hives, swelling of the face, lips, or tongue -changes in vision -chest pain or chest tightness -confusion, trouble speaking or understanding -fast, irregular heartbeat -fingers or toes feel numb, cool, painful -hallucination, loss of contact with reality -high blood pressure -males: prolonged or painful erection -seizures -severe headaches -shortness of breath -suicidal thoughts or other mood changes -trouble walking, dizziness, loss of balance or coordination -uncontrollable head, mouth, neck, arm, or leg movements Side effects that usually do not require medical attention (report to your doctor or health care professional if they continue or are bothersome): -anxious -headache -loss of  appetite -nausea, vomiting -trouble sleeping -weight loss This list may not describe all possible side effects. Call your doctor for medical advice about side effects. You may report side effects to FDA at 1-800-FDA-1088. Where should I keep my medicine? Keep out of the reach of children. This medicine can be abused. Keep your medicine in a safe place to protect it from theft. Do not share this medicine with anyone. Selling or giving away this medicine is dangerous and against the law. Store at room temperature between 15 and 30 degrees C (59 and 86 degrees F). Keep container tightly closed. Protect from light. Throw away any unused medicine after the expiration date. NOTE: This sheet is a summary. It may not cover all possible information. If you have questions about this medicine, talk to your doctor, pharmacist, or health care provider.  2018 Elsevier/Gold Standard (2014-03-08 18:22:45)

## 2018-01-22 NOTE — Progress Notes (Signed)
Subjective:  Patient ID: Cameron Peters, male    DOB: March 15, 1966  Age: 52 y.o. MRN: [id number]  CC: Follow-up   HPI Cameron Peters presents for official follow-up of his attention deficit disorder.  He has had a long-standing history of this it is been able to use Adderall intermittently.  However work is become more stressful for him and he feels as though there is less job security for him mostly due to lower cost [place] alternatives for [place] support he has been taking the Adderall on a daily basis.  There has been improvement in his concentrated abilities and job performance.  He is sleeping better at night.  There has been some weight loss with the drug.  He denies chest pain palpitations or priapism.  Outpatient Medications Prior to Visit  Medication Sig Dispense Refill  . albuterol (VENTOLIN HFA) 108 (90 Base) MCG/ACT inhaler INHALE 2 PUFFS INTO THE LUNGS EVERY 6 HOURS AS NEEDED FOR WHEEZING OR SHORTNESS OF BREATH 18 g 1  . amphetamine-dextroamphetamine (ADDERALL XR) 30 MG 24 hr capsule Take 1 capsule (30 mg total) by mouth as needed. 30 capsule 0  . meloxicam (MOBIC) 15 MG tablet Take 1 tablet (15 mg total) by mouth daily. 30 tablet 0   No facility-administered medications prior to visit.     ROS Review of Systems  Constitutional: Negative for fatigue, fever and unexpected weight change.  Respiratory: Negative.   Cardiovascular: Negative.   Gastrointestinal: Negative.   Genitourinary: Negative.   Psychiatric/Behavioral: Negative for agitation, behavioral problems and decreased concentration.    Objective:  BP (!) 142/90   Pulse (!) 101   Ht 5\' 8"  (1.727 m)   Wt 191 lb 6 oz (86.8 kg)   SpO2 97%   BMI 29.10 kg/m   BP Readings from Last 3 Encounters:  01/22/18 (!) 142/90  08/12/17 118/82  08/07/17 128/80    Wt Readings from Last 3 Encounters:  01/22/18 191 lb 6 oz (86.8 kg)  08/07/17 204 lb 8 oz (92.8 kg)    Physical Exam  Constitutional: He is oriented to  person, place, and time. He appears well-developed and well-nourished. No distress.  HENT:  Head: Normocephalic and atraumatic.  Right Ear: External ear normal.  Left Ear: External ear normal.  Eyes: Conjunctivae are normal. Right eye exhibits no discharge. Left eye exhibits no discharge. No scleral icterus.  Neurological: He is alert and oriented to person, place, and time.  Skin: He is not diaphoretic.  Psychiatric: He has a normal mood and affect. His behavior is normal.    Lab Results  Component Value Date   WBC 6.3 08/07/2017   HGB 14.3 08/07/2017   HCT 43.2 08/07/2017   PLT 304.0 08/07/2017   GLUCOSE 93 08/07/2017   CHOL 167 08/07/2017   TRIG 105.0 08/07/2017   HDL 35.00 (L) 08/07/2017   LDLCALC 111 (H) 08/07/2017   ALT 35 08/07/2017   AST 23 08/07/2017   NA 143 08/07/2017   K 5.4 (H) 08/12/2017   CL 108 08/07/2017   CREATININE 1.15 08/07/2017   BUN 16 08/07/2017   CO2 25 08/07/2017   TSH 2.11 08/07/2017   PSA 0.69 08/07/2017    No results found.  Assessment & Plan:   Cameron Peters was seen today for follow-up.  Diagnoses and all orders for this visit:  Attention and concentration deficit -     Discontinue: amphetamine-dextroamphetamine (ADDERALL XR) 30 MG 24 hr capsule; Take 1 capsule (30 mg total) by mouth as needed. -  Discontinue: amphetamine-dextroamphetamine (ADDERALL XR) 30 MG 24 hr capsule; Take 1 capsule (30 mg total) by mouth as needed. -     amphetamine-dextroamphetamine (ADDERALL XR) 30 MG 24 hr capsule; Take 1 capsule (30 mg total) by mouth as needed.   I have discontinued Cameron Peters's meloxicam. I am also having him maintain his albuterol and amphetamine-dextroamphetamine.  Meds ordered this encounter  Medications  . DISCONTD: amphetamine-dextroamphetamine (ADDERALL XR) 30 MG 24 hr capsule    Sig: Take 1 capsule (30 mg total) by mouth as needed.    Dispense:  30 capsule    Refill:  0    Fill now  . DISCONTD: amphetamine-dextroamphetamine  (ADDERALL XR) 30 MG 24 hr capsule    Sig: Take 1 capsule (30 mg total) by mouth as needed.    Dispense:  30 capsule    Refill:  0    Fill in 30 days  . amphetamine-dextroamphetamine (ADDERALL XR) 30 MG 24 hr capsule    Sig: Take 1 capsule (30 mg total) by mouth as needed.    Dispense:  30 capsule    Refill:  0    Fill in 60 days   Patient signed a formal contract today.  Database was reviewed.  Agrees to q. 91-month evaluations.  Blood pressure noted to be elevated today.  We will follow-up in 3 months for recheck of his white blood pressure.  Follow-up: Return in about 3 months (around 04/23/2018).  Mliss Sax, MD

## 2018-03-19 DIAGNOSIS — I808 Phlebitis and thrombophlebitis of other sites: Secondary | ICD-10-CM

## 2018-03-19 HISTORY — DX: Phlebitis and thrombophlebitis of other sites: I80.8

## 2018-03-31 ENCOUNTER — Encounter: Payer: Self-pay | Admitting: Family Medicine

## 2018-04-03 ENCOUNTER — Emergency Department (HOSPITAL_COMMUNITY): Payer: Managed Care, Other (non HMO)

## 2018-04-03 ENCOUNTER — Inpatient Hospital Stay (HOSPITAL_COMMUNITY)
Admission: EM | Admit: 2018-04-03 | Discharge: 2018-04-10 | DRG: 871 | Disposition: A | Discharge status: Home / Self Care | Payer: Managed Care, Other (non HMO) | Attending: Internal Medicine | Admitting: Internal Medicine

## 2018-04-03 ENCOUNTER — Encounter (HOSPITAL_COMMUNITY): Payer: Self-pay | Admitting: *Deleted

## 2018-04-03 ENCOUNTER — Other Ambulatory Visit: Payer: Self-pay

## 2018-04-03 DIAGNOSIS — Z9049 Acquired absence of other specified parts of digestive tract: Secondary | ICD-10-CM | POA: Diagnosis not present

## 2018-04-03 DIAGNOSIS — R6520 Severe sepsis without septic shock: Secondary | ICD-10-CM | POA: Diagnosis present

## 2018-04-03 DIAGNOSIS — J45909 Unspecified asthma, uncomplicated: Secondary | ICD-10-CM | POA: Diagnosis present

## 2018-04-03 DIAGNOSIS — K5730 Diverticulosis of large intestine without perforation or abscess without bleeding: Secondary | ICD-10-CM | POA: Diagnosis present

## 2018-04-03 DIAGNOSIS — Z720 Tobacco use: Secondary | ICD-10-CM | POA: Diagnosis not present

## 2018-04-03 DIAGNOSIS — K55069 Acute infarction of intestine, part and extent unspecified: Secondary | ICD-10-CM | POA: Diagnosis present

## 2018-04-03 DIAGNOSIS — A419 Sepsis, unspecified organism: Secondary | ICD-10-CM | POA: Diagnosis present

## 2018-04-03 DIAGNOSIS — I81 Portal vein thrombosis: Secondary | ICD-10-CM

## 2018-04-03 DIAGNOSIS — R1033 Periumbilical pain: Secondary | ICD-10-CM

## 2018-04-03 DIAGNOSIS — R41840 Attention and concentration deficit: Secondary | ICD-10-CM | POA: Diagnosis not present

## 2018-04-03 DIAGNOSIS — Z880 Allergy status to penicillin: Secondary | ICD-10-CM | POA: Diagnosis not present

## 2018-04-03 DIAGNOSIS — Z79899 Other long term (current) drug therapy: Secondary | ICD-10-CM

## 2018-04-03 DIAGNOSIS — Z833 Family history of diabetes mellitus: Secondary | ICD-10-CM

## 2018-04-03 DIAGNOSIS — I340 Nonrheumatic mitral (valve) insufficiency: Secondary | ICD-10-CM | POA: Diagnosis not present

## 2018-04-03 DIAGNOSIS — D509 Iron deficiency anemia, unspecified: Secondary | ICD-10-CM | POA: Diagnosis present

## 2018-04-03 DIAGNOSIS — F17210 Nicotine dependence, cigarettes, uncomplicated: Secondary | ICD-10-CM | POA: Diagnosis present

## 2018-04-03 DIAGNOSIS — N179 Acute kidney failure, unspecified: Secondary | ICD-10-CM | POA: Diagnosis present

## 2018-04-03 DIAGNOSIS — K642 Third degree hemorrhoids: Secondary | ICD-10-CM | POA: Diagnosis present

## 2018-04-03 DIAGNOSIS — K210 Gastro-esophageal reflux disease with esophagitis: Secondary | ICD-10-CM | POA: Diagnosis present

## 2018-04-03 DIAGNOSIS — K219 Gastro-esophageal reflux disease without esophagitis: Secondary | ICD-10-CM | POA: Diagnosis present

## 2018-04-03 DIAGNOSIS — R109 Unspecified abdominal pain: Secondary | ICD-10-CM

## 2018-04-03 DIAGNOSIS — I808 Phlebitis and thrombophlebitis of other sites: Secondary | ICD-10-CM | POA: Diagnosis present

## 2018-04-03 DIAGNOSIS — Z7901 Long term (current) use of anticoagulants: Secondary | ICD-10-CM

## 2018-04-03 DIAGNOSIS — I8290 Acute embolism and thrombosis of unspecified vein: Secondary | ICD-10-CM

## 2018-04-03 HISTORY — DX: Noninfective gastroenteritis and colitis, unspecified: K52.9

## 2018-04-03 HISTORY — DX: Phlebitis and thrombophlebitis of other sites: I80.8

## 2018-04-03 LAB — CBC WITH DIFFERENTIAL/PLATELET
Abs Immature Granulocytes: 0.07 10*3/uL (ref 0.00–0.07)
Basophils Absolute: 0 10*3/uL (ref 0.0–0.1)
Basophils Relative: 0 %
Eosinophils Absolute: 0.1 10*3/uL (ref 0.0–0.5)
Eosinophils Relative: 1 %
HCT: 38.1 % — ABNORMAL LOW (ref 39.0–52.0)
Hemoglobin: 11.4 g/dL — ABNORMAL LOW (ref 13.0–17.0)
Immature Granulocytes: 1 %
Lymphocytes Relative: 15 %
Lymphs Abs: 1.9 10*3/uL (ref 0.7–4.0)
MCH: 21.1 pg — ABNORMAL LOW (ref 26.0–34.0)
MCHC: 29.9 g/dL — ABNORMAL LOW (ref 30.0–36.0)
MCV: 70.6 fL — ABNORMAL LOW (ref 80.0–100.0)
Monocytes Absolute: 1.7 10*3/uL — ABNORMAL HIGH (ref 0.1–1.0)
Monocytes Relative: 13 %
Neutro Abs: 9.1 10*3/uL — ABNORMAL HIGH (ref 1.7–7.7)
Neutrophils Relative %: 70 %
Platelets: 368 10*3/uL (ref 150–400)
RBC: 5.4 MIL/uL (ref 4.22–5.81)
RDW: 15.9 % — ABNORMAL HIGH (ref 11.5–15.5)
WBC: 12.8 10*3/uL — ABNORMAL HIGH (ref 4.0–10.5)
nRBC: 0 % (ref 0.0–0.2)

## 2018-04-03 LAB — COMPREHENSIVE METABOLIC PANEL
ALT: 74 U/L — ABNORMAL HIGH (ref 0–44)
AST: 40 U/L (ref 15–41)
Albumin: 3.5 g/dL (ref 3.5–5.0)
Alkaline Phosphatase: 148 U/L — ABNORMAL HIGH (ref 38–126)
Anion gap: 10 (ref 5–15)
BUN: 14 mg/dL (ref 6–20)
CO2: 20 mmol/L — ABNORMAL LOW (ref 22–32)
Calcium: 8.7 mg/dL — ABNORMAL LOW (ref 8.9–10.3)
Chloride: 104 mmol/L (ref 98–111)
Creatinine, Ser: 1.04 mg/dL (ref 0.61–1.24)
GFR calc Af Amer: >60 mL/min (ref >60)
GFR calc non Af Amer: >60 mL/min (ref >60)
Glucose, Bld: 94 mg/dL (ref 70–99)
Potassium: 4.1 mmol/L (ref 3.5–5.1)
Sodium: 134 mmol/L — ABNORMAL LOW (ref 135–145)
Total Bilirubin: 0.8 mg/dL (ref 0.3–1.2)
Total Protein: 7.9 g/dL (ref 6.5–8.1)

## 2018-04-03 LAB — I-STAT CHEM 8, ED
BUN: 17 mg/dL (ref 6–20)
Calcium, Ion: 1.03 mmol/L — ABNORMAL LOW (ref 1.15–1.40)
Chloride: 106 mmol/L (ref 98–111)
Creatinine, Ser: 1.1 mg/dL (ref 0.61–1.24)
Glucose, Bld: 100 mg/dL — ABNORMAL HIGH (ref 70–99)
HCT: 38 % — ABNORMAL LOW (ref 39.0–52.0)
Hemoglobin: 12.9 g/dL — ABNORMAL LOW (ref 13.0–17.0)
Potassium: 3.9 mmol/L (ref 3.5–5.1)
Sodium: 135 mmol/L (ref 135–145)
TCO2: 21 mmol/L — ABNORMAL LOW (ref 22–32)

## 2018-04-03 LAB — I-STAT CG4 LACTIC ACID, ED: Lactic Acid, Venous: 1.64 mmol/L (ref 0.5–1.9)

## 2018-04-03 LAB — LIPASE, BLOOD: Lipase: 34 U/L (ref 11–51)

## 2018-04-03 LAB — LIPID PANEL
CHOL/HDL RATIO: 8.1 ratio
Cholesterol: 122 mg/dL (ref 0–200)
HDL: 15 mg/dL — ABNORMAL LOW (ref >40)
LDL Cholesterol: 84 mg/dL (ref 0–99)
TRIGLYCERIDES: 115 mg/dL (ref <150)
VLDL: 23 mg/dL (ref 0–40)

## 2018-04-03 LAB — HEPARIN LEVEL (UNFRACTIONATED): Heparin Unfractionated: <0.1 IU/mL — ABNORMAL LOW (ref 0.30–0.70)

## 2018-04-03 LAB — APTT: aPTT: 39 seconds — ABNORMAL HIGH (ref 24–36)

## 2018-04-03 LAB — PROTIME-INR
INR: 1.1
Prothrombin Time: 14.1 seconds (ref 11.4–15.2)

## 2018-04-03 LAB — PSA: Prostatic Specific Antigen: 0.4 ng/mL (ref 0.00–4.00)

## 2018-04-03 LAB — ANTITHROMBIN III: AntiThromb III Func: 79 % (ref 75–120)

## 2018-04-03 MED ORDER — IOHEXOL 300 MG/ML  SOLN
100.0000 mL | Freq: Once | INTRAMUSCULAR | Status: AC | PRN
  Administered 2018-04-03: 100 mL via INTRAVENOUS

## 2018-04-03 MED ORDER — POTASSIUM CHLORIDE IN NACL 20-0.9 MEQ/L-% IV SOLN
INTRAVENOUS | Status: DC
  Administered 2018-04-03 – 2018-04-09 (×9): via INTRAVENOUS
  Filled 2018-04-03 (×8): qty 1000

## 2018-04-03 MED ORDER — NICOTINE 14 MG/24HR TD PT24
14.0000 mg | MEDICATED_PATCH | Freq: Every day | TRANSDERMAL | Status: DC
  Administered 2018-04-05 – 2018-04-10 (×6): 14 mg via TRANSDERMAL
  Filled 2018-04-03 (×8): qty 1

## 2018-04-03 MED ORDER — ONDANSETRON HCL 4 MG PO TABS: 4.0000 mg | ORAL_TABLET | Freq: Four times a day (QID) | ORAL | Status: DC | PRN

## 2018-04-03 MED ORDER — HEPARIN (PORCINE) 25000 UT/250ML-% IV SOLN
2300.0000 [IU]/h | INTRAVENOUS | Status: AC
  Administered 2018-04-03: 1500 [IU]/h via INTRAVENOUS
  Administered 2018-04-04 (×2): 2100 [IU]/h via INTRAVENOUS
  Administered 2018-04-04: 1750 [IU]/h via INTRAVENOUS
  Administered 2018-04-05 – 2018-04-07 (×5): 2100 [IU]/h via INTRAVENOUS
  Administered 2018-04-08: 2250 [IU]/h via INTRAVENOUS
  Administered 2018-04-08: 2400 [IU]/h via INTRAVENOUS
  Administered 2018-04-09: 2300 [IU]/h via INTRAVENOUS
  Administered 2018-04-09: 2400 [IU]/h via INTRAVENOUS
  Filled 2018-04-03 (×15): qty 250

## 2018-04-03 MED ORDER — LEVOFLOXACIN IN D5W 750 MG/150ML IV SOLN
750.0000 mg | INTRAVENOUS | Status: DC
  Administered 2018-04-03: 750 mg via INTRAVENOUS
  Filled 2018-04-03: qty 150

## 2018-04-03 MED ORDER — SODIUM CHLORIDE 0.9 % IV BOLUS
1000.0000 mL | Freq: Once | INTRAVENOUS | Status: AC
  Administered 2018-04-03: 1000 mL via INTRAVENOUS

## 2018-04-03 MED ORDER — MORPHINE SULFATE (PF) 4 MG/ML IV SOLN
4.0000 mg | Freq: Once | INTRAVENOUS | Status: AC
  Administered 2018-04-03: 4 mg via INTRAVENOUS
  Filled 2018-04-03: qty 1

## 2018-04-03 MED ORDER — HYDROMORPHONE HCL 1 MG/ML IJ SOLN
1.0000 mg | Freq: Once | INTRAMUSCULAR | Status: DC
  Filled 2018-04-03: qty 1

## 2018-04-03 MED ORDER — ONDANSETRON HCL 4 MG/2ML IJ SOLN
4.0000 mg | Freq: Once | INTRAMUSCULAR | Status: AC
  Administered 2018-04-03: 4 mg via INTRAVENOUS
  Filled 2018-04-03: qty 2

## 2018-04-03 MED ORDER — HEPARIN BOLUS VIA INFUSION
2500.0000 [IU] | Freq: Once | INTRAVENOUS | Status: AC
  Administered 2018-04-03: 2500 [IU] via INTRAVENOUS
  Filled 2018-04-03: qty 2500

## 2018-04-03 MED ORDER — HEPARIN BOLUS VIA INFUSION
4000.0000 [IU] | Freq: Once | INTRAVENOUS | Status: AC
  Administered 2018-04-03: 4000 [IU] via INTRAVENOUS
  Filled 2018-04-03: qty 4000

## 2018-04-03 MED ORDER — HYDROMORPHONE HCL 1 MG/ML IJ SOLN
0.5000 mg | Freq: Once | INTRAMUSCULAR | Status: AC
  Administered 2018-04-03: 0.5 mg via INTRAVENOUS
  Filled 2018-04-03: qty 1

## 2018-04-03 MED ORDER — HYDROMORPHONE HCL 1 MG/ML IJ SOLN
1.0000 mg | INTRAMUSCULAR | Status: DC | PRN
  Administered 2018-04-03 – 2018-04-04 (×6): 1 mg via INTRAVENOUS
  Filled 2018-04-03 (×6): qty 1

## 2018-04-03 MED ORDER — HYDROMORPHONE HCL 1 MG/ML IJ SOLN
0.5000 mg | Freq: Once | INTRAMUSCULAR | Status: AC
  Administered 2018-04-03: 0.5 mg via INTRAVENOUS

## 2018-04-03 MED ORDER — ONDANSETRON HCL 4 MG/2ML IJ SOLN: 4.0000 mg | Freq: Four times a day (QID) | INTRAMUSCULAR | Status: DC | PRN

## 2018-04-03 NOTE — H&P (Signed)
PCP:   Mliss Sax, MD   Chief Complaint:  Abdominal pain  HPI: This  Is a 52 y/o male who presents with c/o abdominal pain initially noted on Tuesday AM. Initially it was milder. He felt he was febrile. He had body aches and pain. He treated himself for a viral illness since. The fever finally broke Friday. After that his stomach pain became exponentially worse. His pain is centrally located without much radiation. He denies nausea or vomiting. He has never had this discomfort before. He denies any blood in his stool. He has not eaten much in the last few days. He finally decided to come to the ER. His pain has been continuous and sharp. At its worse it is 10/10, currently 10/10.  CTA reveals SMV thrombosis.  History provided by the patient and his wife who is present at bedside.   Review of Systems:  The patient denies anorexia, fever, weight loss,, vision loss, decreased hearing, hoarseness, chest pain, syncope, dyspnea on exertion, peripheral edema, balance deficits, hemoptysis, abdominal pain, melena, hematochezia, severe indigestion/heartburn, hematuria, incontinence, genital sores, muscle weakness, suspicious skin lesions, transient blindness, difficulty walking, depression, unusual weight change, abnormal bleeding, enlarged lymph nodes, angioedema, and breast masses.  Past Medical History: Past Medical History:  Diagnosis Date  . Asthma   . Colitis    Past Surgical History:  Procedure Laterality Date  . CHOLECYSTECTOMY    . SHOULDER SURGERY      Medications: Prior to Admission medications   Medication Sig Start Date End Date Taking? Authorizing Provider  albuterol (VENTOLIN HFA) 108 (90 Base) MCG/ACT inhaler INHALE 2 PUFFS INTO THE LUNGS EVERY 6 HOURS AS NEEDED FOR WHEEZING OR SHORTNESS OF BREATH Patient taking differently: Inhale 1-2 puffs into the lungs every 6 (six) hours as needed for wheezing or shortness of breath.  10/07/17  Yes Mliss Sax, MD   amphetamine-dextroamphetamine (ADDERALL XR) 30 MG 24 hr capsule Take 1 capsule (30 mg total) by mouth as needed. Patient taking differently: Take 30 mg by mouth daily.  01/22/18  Yes Mliss Sax, MD  b complex vitamins capsule Take 1 capsule by mouth daily.   Yes [provider]  Omega-3 1000 MG CAPS Take 1,000 mg by mouth daily.   Yes [provider]  omega-3 acid ethyl esters (LOVAZA) 1 g capsule Take 1 g by mouth daily.   Yes [provider]  Probiotic Product (PROBIOTIC DAILY PO) Take 20 mg by mouth daily. 4 strain   Yes [provider]    Allergies:   Allergies  Allergen Reactions  . Penicillins Other (See Comments)    Childhood reaction.    Social History:  reports that he has been smoking. He has been smoking about 1.00 pack per day. He has never used smokeless tobacco. He reports that he does not drink alcohol or use drugs.  Family History: Diabetes Mellitus  Physical Exam: Vitals:   04/03/18 0713 04/03/18 0715 04/03/18 0912 04/03/18 1024  BP: 126/82  116/66 124/70  Pulse: 98  85 86  Resp: (!) 30  (!) 24 13  Temp: 98 F (36.7 C)     TempSrc: Oral     SpO2: 100%  100% 96%  Weight:  88.5 kg    Height:  5\' 7"  (1.702 m)      General:  Alert and oriented times three, well developed and nourished, no acute distress Eyes: PERRLA, pink conjunctiva, no scleral icterus ENT: Moist oral mucosa, neck  supple, no thyromegaly Lungs: clear to ascultation, no wheeze, no crackles, no use of accessory muscles Cardiovascular: regular rate and rhythm, no regurgitation, no gallops, no murmurs. No carotid bruits, no JVD Abdomen: soft, positive BS, non-tender, non-distended, no organomegaly, not an acute abdomen GU: not examined Neuro: CN II - XII grossly intact, sensation intact Musculoskeletal: strength 5/5 all extremities, no clubbing, cyanosis or edema Skin: no rash, no subcutaneous crepitation, no decubitus Psych: appropriate  patient   Labs on Admission:  Recent Labs    04/03/18 0723 04/03/18 0732  NA 134* 135  K 4.1 3.9  CL 104 106  CO2 20*  --   GLUCOSE 94 100*  BUN 14 17  CREATININE 1.04 1.10  CALCIUM 8.7*  --    Recent Labs    04/03/18 0723  AST 40  ALT 74*  ALKPHOS 148*  BILITOT 0.8  PROT 7.9  ALBUMIN 3.5   Recent Labs    04/03/18 0723  LIPASE 34   Recent Labs    04/03/18 0723 04/03/18 0732  WBC 12.8*  --   NEUTROABS 9.1*  --   HGB 11.4* 12.9*  HCT 38.1* 38.0*  MCV 70.6*  --   PLT 368  --    No results for input(s): CKTOTAL, CKMB, CKMBINDEX, TROPONINI in the last 72 hours. Invalid input(s): POCBNP No results for input(s): DDIMER in the last 72 hours. No results for input(s): HGBA1C in the last 72 hours. No results for input(s): CHOL, HDL, LDLCALC, TRIG, CHOLHDL, LDLDIRECT in the last 72 hours. No results for input(s): TSH, T4TOTAL, T3FREE, THYROIDAB in the last 72 hours.  Invalid input(s): FREET3 No results for input(s): VITAMINB12, FOLATE, FERRITIN, TIBC, IRON, RETICCTPCT in the last 72 hours.  Micro Results: No results found for this or any previous visit (from the past 240 hour(s)).   Radiological Exams on Admission: Dg Chest 2 View  Result Date: 04/03/2018 CLINICAL DATA:  Shortness of breath.  Recent flu. EXAM: CHEST - 2 VIEW COMPARISON:  Chest x-ray dated August 29, 2011. FINDINGS: The heart size and mediastinal contours are within normal limits. Normal pulmonary vascularity. Mildly coarsened interstitial markings are likely smoking related. No focal consolidation, pleural effusion, or pneumothorax. No acute osseous abnormality. IMPRESSION: No active cardiopulmonary disease. Electronically Signed   By: Obie Dredge M.D.   On: 04/03/2018 08:57   Ct Abdomen Pelvis W Contrast  Result Date: 04/03/2018 CLINICAL DATA:  Generalized abdominal pain EXAM: CT ABDOMEN AND PELVIS WITH CONTRAST TECHNIQUE: Multidetector CT imaging of the abdomen and pelvis was performed using  the standard protocol following bolus administration of intravenous contrast. CONTRAST:  OMNIPAQUE  300 COMPARISON:  01/19/2009 FINDINGS: Lower chest: No acute abnormality. Hepatobiliary: 15 mm left hepatic cyst is noted. The gallbladder has been surgically removed. Pancreas: Unremarkable. No pancreatic ductal dilatation or surrounding inflammatory changes. Spleen: Normal in size without focal abnormality. Adrenals/Urinary Tract: Adrenal glands are within normal limits. Kidneys are well visualized bilaterally. Right renal cyst is noted which measures 4.8 cm in greatest dimension. This is increased from the prior exam. No renal calculi or obstructive changes are seen. Bladder is well distended. Stomach/Bowel: In the mid abdomen, there is a hyperdense loop of small bowel identified without definitive obstruction. Adjacent to this, there is evidence of thrombosis of the superior mesenteric vein and its branches which extends to just short of the splenoportal confluence. This involves primarily the ileal vein branches although a few of the more proximal jejunal vein branches are noted to be  involved as well. The colon is well distended without focal abnormality. A few scattered diverticula are noted. No obstructive changes are seen. The appendix is within normal limits. Vascular/Lymphatic: Aortic atherosclerosis. No enlarged abdominal or pelvic lymph nodes. Reproductive: Prostate is unremarkable. Other: No abdominal wall hernia or abnormality. No abdominopelvic ascites. Musculoskeletal: No acute or significant osseous findings. 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 Associated mesenteric edema and hyperdense loop of ileum is seen. No obstructive changes are noted. Hepatic and renal cysts. Diverticulosis without diverticulitis. Critical Value/emergent  results were called by telephone at the time of interpretation on 04/03/2018 at 9:06 am to [facility] Abe IncEXANDRA LAW, PA , who verbally acknowledged these results. Electronically Signed   By: Alcide CleverMark  Lukens M.D.   On: 04/03/2018 09:08    Assessment/Plan Present on Admission: . thrombosis superior mesenteric vein -admit to medsurg -NPO, IVF hydration -heparin gtt, IV levaquin and pain meds ordered -surgeon consulted -hypercoagg w/u ordered in ER, will add tumor markers and lipid panel. No clear etiology -lipase level normal  . Tobacco use -nicotine patch ordered, duonebs  Aleta Manternach 04/03/2018, 10:31 AM

## 2018-04-03 NOTE — Progress Notes (Signed)
ANTICOAGULATION CONSULT NOTE - Initial Consult  Pharmacy Consult for Heparin Indication: acute thrombosis of the superior mesenteric  Allergies  Allergen Reactions  . Penicillins Other (See Comments)    Childhood reaction.    Patient Measurements: Height: 5\' 9"  (175.3 cm) Weight: 195 lb (88.5 kg) IBW/kg (Calculated) : 70.7 Heparin Dosing Weight:  84 kg  Vital Signs: Temp: 98.6 F (37 C) (11/16 1400) Temp Source: Oral (11/16 1400) BP: 111/67 (11/16 1400) Pulse Rate: 83 (11/16 1400)  Labs: Recent Labs    04/03/18 0723 04/03/18 0732 04/03/18 0929 04/03/18 1627  HGB 11.4* 12.9*  --   --   HCT 38.1* 38.0*  --   --   PLT 368  --   --   --   APTT  --   --  39*  --   LABPROT  --   --  14.1  --   INR  --   --  1.10  --   HEPARINUNFRC  --   --   --  <0.10*  CREATININE 1.04 1.10  --   --     Estimated Creatinine Clearance: 85.5 mL/min (by C-G formula based on SCr of 1.1 mg/dL).  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 Hgb 12  Initial hep lvl undetectable - running as ordered per RN  Goal of Therapy:  Heparin level 0.3-0.7 units/ml Monitor platelets by anticoagulation protocol: Yes   Plan:  Bolus 2500 units heparin x 1 Increase gtt to 1750 units/hr Next lvl 0000  Isaac BlissMichael Corayma Cashatt, PharmD, BCPS, BCCCP Clinical Pharmacist [phone]  Please check AMION for all [facility] numbers  04/03/2018 5:18 PM

## 2018-04-03 NOTE — ED Triage Notes (Signed)
Lab tech at bed side to collect labs ordered

## 2018-04-03 NOTE — ED Provider Notes (Addendum)
[place] EMERGENCY DEPARTMENT Provider Note   CSN: [id number] Arrival date & time: 04/03/18  0703     History   Chief Complaint Chief Complaint  Patient presents with  . Abdominal Pain    HX of Colitis    HPI Cameron Peters is a 52 y.o. male with history of asthma who presents with abdominal pain that began this morning.  It is periumbilical and left lower quadrant.  His pain is radiating somewhat around to his back.  He had fairly normal bowel movement 3 to 4 hours ago.  He reports taking a laxative.  Patient denies any nausea, vomiting, diarrhea, bloody stools.  He also denies any chest pain.  Patient has shortness of breath only to breathe through the pain.  He reports he is getting over the flu, but his stomach was fine last week.  Mostly just had fever.  He was never tested for the flu and was never evaluated.  Not taking medication for his pain.  He reports history of colitis, but this is much worse.  He reports he also had bloody stools then, which she does not have today.  HPI  Past Medical History:  Diagnosis Date  . Asthma   . Colitis     Patient Active Problem List   Diagnosis Date Noted  . Hyperkalemia 08/13/2017  . Need for vaccination with 13-polyvalent pneumococcal conjugate vaccine 08/13/2017  . Encounter for health maintenance examination with abnormal findings 08/07/2017  . Attention and concentration deficit 08/07/2017  . Heme positive stool 08/07/2017  . Gastroesophageal reflux disease 08/07/2017  . Tobacco use 08/07/2017  . Non-seasonal allergic rhinitis due to pollen 08/07/2017  . Reactive airway disease 08/07/2017  . Left shoulder pain 08/07/2017    Past Surgical History:  Procedure Laterality Date  . CHOLECYSTECTOMY    . SHOULDER SURGERY          Home Medications    Prior to Admission medications   Medication Sig Start Date End Date Taking? Authorizing Provider  albuterol (VENTOLIN HFA) 108 (90 Base) MCG/ACT inhaler  INHALE 2 PUFFS INTO THE LUNGS EVERY 6 HOURS AS NEEDED FOR WHEEZING OR SHORTNESS OF BREATH 10/07/17   Libby Maw, MD  amphetamine-dextroamphetamine (ADDERALL XR) 30 MG 24 hr capsule Take 1 capsule (30 mg total) by mouth as needed. 01/22/18   Libby Maw, MD    Family History No family history on file.  Social History Social History   Tobacco Use  . Smoking status: Current Every Day Smoker    Packs/day: 1.00  . Smokeless tobacco: Never Used  Substance Use Topics  . Alcohol use: No  . Drug use: No     Allergies   Penicillins   Review of Systems Review of Systems  Constitutional: Positive for fever (none since 1230 yesterday). Negative for chills.  HENT: Negative for facial swelling and sore throat.   Respiratory: Positive for shortness of breath (only with pain).   Cardiovascular: Negative for chest pain.  Gastrointestinal: Positive for abdominal pain. Negative for nausea and vomiting.  Genitourinary: Negative for dysuria.  Musculoskeletal: Negative for back pain.  Skin: Negative for rash and wound.  Neurological: Negative for headaches.  Psychiatric/Behavioral: The patient is not nervous/anxious.      Physical Exam Updated Vital Signs BP 116/66 (BP Location: Left Arm)   Pulse 85   Temp 98 F (36.7 C) (Oral)   Resp (!) 24   Ht _0  (1.702 m)   Wt 88.5 kg  SpO2 100%   BMI 30.54 kg/m   Physical Exam  Constitutional: He appears well-developed and well-nourished. No distress.  HENT:  Head: Normocephalic and atraumatic.  Mouth/Throat: Oropharynx is clear and moist. No oropharyngeal exudate.  Eyes: Pupils are equal, round, and reactive to light. Conjunctivae are normal. Right eye exhibits no discharge. Left eye exhibits no discharge. No scleral icterus.  Neck: Normal range of motion. Neck supple. No thyromegaly present.  Cardiovascular: Normal rate, regular rhythm, normal heart sounds and intact distal pulses. Exam reveals no gallop and no  friction rub.  No murmur heard. Pulmonary/Chest: Effort normal and breath sounds normal. No stridor. Tachypnea (s/s pain) noted. No respiratory distress. He has no wheezes. He has no rales.  Abdominal: Soft. Bowel sounds are normal. He exhibits no distension. There is tenderness in the periumbilical area and left lower quadrant. There is no rebound, no guarding and no CVA tenderness.  Musculoskeletal: He exhibits no edema.  Lymphadenopathy:    He has no cervical adenopathy.  Neurological: He is alert. Coordination normal.  Skin: Skin is warm and dry. No rash noted. He is not diaphoretic. No pallor.  Psychiatric: He has a normal mood and affect.  Nursing note and vitals reviewed.    ED Treatments / Results  Labs (all labs ordered are listed, but only abnormal results are displayed) Labs Reviewed  CBC WITH DIFFERENTIAL/PLATELET - Abnormal; Notable for the following components:      Result Value   WBC 12.8 (*)    Hemoglobin 11.4 (*)    HCT 38.1 (*)    MCV 70.6 (*)    MCH 21.1 (*)    MCHC 29.9 (*)    RDW 15.9 (*)    Neutro Abs 9.1 (*)    Monocytes Absolute 1.7 (*)    All other components within normal limits  COMPREHENSIVE METABOLIC PANEL - Abnormal; Notable for the following components:   Sodium 134 (*)    CO2 20 (*)    Calcium 8.7 (*)    ALT 74 (*)    Alkaline Phosphatase 148 (*)    All other components within normal limits  I-STAT CHEM 8, ED - Abnormal; Notable for the following components:   Glucose, Bld 100 (*)    Calcium, Ion 1.03 (*)    TCO2 21 (*)    Hemoglobin 12.9 (*)    HCT 38.0 (*)    All other components within normal limits  LIPASE, BLOOD  PROTIME-INR  APTT  ANTITHROMBIN III  PROTEIN C ACTIVITY  PROTEIN C, TOTAL  PROTEIN S ACTIVITY  PROTEIN S, TOTAL  LUPUS ANTICOAGULANT PANEL  BETA-2-GLYCOPROTEIN I ABS, IGG/M/A  HOMOCYSTEINE  FACTOR 5 LEIDEN  PROTHROMBIN GENE MUTATION  CARDIOLIPIN ANTIBODIES, IGG, IGM, IGA  HEPARIN LEVEL (UNFRACTIONATED)  I-STAT  CG4 LACTIC ACID, ED    EKG EKG Interpretation  Date/Time:  Saturday April 03 2018 09:31:12 EST Ventricular Rate:  84 PR Interval:  146 QRS Duration: 86 QT Interval:  362 QTC Calculation: 427 R Axis:   69 Text Interpretation:  Normal sinus rhythm Normal ECG No significant change since last tracing Confirmed by Isla Pence [id number]) on 04/03/2018 9:40:44 AM   Radiology Dg Chest 2 View  Result Date: 04/03/2018 CLINICAL DATA:  Shortness of breath.  Recent flu. EXAM: CHEST - 2 VIEW COMPARISON:  Chest x-ray dated August 29, 2011. FINDINGS: The heart size and mediastinal contours are within normal limits. Normal pulmonary vascularity. Mildly coarsened interstitial markings are likely smoking related. No focal consolidation, pleural effusion, or  pneumothorax. No acute osseous abnormality. IMPRESSION: No active cardiopulmonary disease. Electronically Signed   By: Titus Dubin M.D.   On: 04/03/2018 08:57   Ct Abdomen Pelvis W Contrast  Result Date: 04/03/2018 CLINICAL DATA:  Generalized abdominal pain EXAM: CT ABDOMEN AND PELVIS WITH CONTRAST TECHNIQUE: Multidetector CT imaging of the abdomen and pelvis was performed using the standard protocol following bolus administration of intravenous contrast. CONTRAST:  166m OMNIPAQUE  300 COMPARISON:  01/19/2009 FINDINGS: Lower chest: No acute abnormality. Hepatobiliary: 15 mm left hepatic cyst is noted. The gallbladder has been surgically removed. Pancreas: Unremarkable. No pancreatic ductal dilatation or surrounding inflammatory changes. Spleen: Normal in size without focal abnormality. Adrenals/Urinary Tract: Adrenal glands are within normal limits. Kidneys are well visualized bilaterally. Right renal cyst is noted which measures 4.8 cm in greatest dimension. This is increased from the prior exam. No renal calculi or obstructive changes are seen. Bladder is well distended. Stomach/Bowel: In the mid abdomen, there is a hyperdense loop of small bowel  identified without definitive obstruction. Adjacent to this, there is evidence of thrombosis of the superior mesenteric vein and its branches which extends to just short of the splenoportal confluence. This involves primarily the ileal vein branches although a few of the more proximal jejunal vein branches are noted to be involved as well. The colon is well distended without focal abnormality. A few scattered diverticula are noted. No obstructive changes are seen. The appendix is within normal limits. Vascular/Lymphatic: Aortic atherosclerosis. No enlarged abdominal or pelvic lymph nodes. Reproductive: Prostate is unremarkable. Other: No abdominal wall hernia or abnormality. No abdominopelvic ascites. Musculoskeletal: No acute or significant osseous findings. 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 Associated mesenteric edema and hyperdense loop of ileum is seen. No obstructive changes are noted. Hepatic and renal cysts. Diverticulosis without diverticulitis. Critical Value/emergent results were called by telephone at the time of interpretation on 04/03/2018 at 9:06 am to ALa Plata PA , who verbally acknowledged these results. Electronically Signed   By: MInez CatalinaM.D.   On: 04/03/2018 09:08    Procedures Procedures (including critical care time)  CRITICAL CARE Performed by: AFrederica Kuster  Total critical care time: 45 minutes  Critical care time was exclusive of separately billable procedures and treating other patients.  Critical care was necessary to treat or prevent imminent or life-threatening deterioration.  Critical care was time spent personally by me on the following activities: development of treatment plan with patient and/or surrogate as well as nursing, discussions with consultants, evaluation of patient's response  to treatment, examination of patient, obtaining history from patient or surrogate, ordering and performing treatments and interventions, ordering and review of laboratory studies, ordering and review of radiographic studies, pulse oximetry and re-evaluation of patient's condition.   Medications Ordered in ED Medications  heparin bolus via infusion 4,000 Units (has no administration in time range)  heparin ADULT infusion 100 units/mL (25000 units/2567msodium chloride 0.45%) (has no administration in time range)  morphine 4 MG/ML injection 4 mg (4 mg Intravenous Given 04/03/18 0728)  ondansetron (ZOFRAN) injection 4 mg (4 mg Intravenous Given 04/03/18 0728)  sodium chloride 0.9 % bolus 1,000 mL (1,000 mLs Intravenous New Bag/Given 04/03/18 0729)  HYDROmorphone (DILAUDID) injection 0.5 mg (0.5 mg Intravenous Given 04/03/18 0916)  iohexol (OMNIPAQUE) 300 MG/ML solution 100 mL (100 mLs Intravenous Contrast Given 04/03/18 0827)  Initial Impression / Assessment and Plan / ED Course  I have reviewed the triage vital signs and the nursing notes.  Pertinent labs & imaging results that were available during my care of the patient were reviewed by me and considered in my medical decision making (see chart for details).  Clinical Course as of Apr 03 1013  Sat Apr 03, 2018  0881 On reassessment after morphine, patient reports his pain is improved, however still having some stabbing pain.  He is breathing at a much more normal rate now.  Will dose 0.5 mg Dilaudid to help with residual pain.   [AL]  [id number] Patient found to have SMV thrombosis.  I consulted vascular surgery and spoke with Dr. Oneida Alar who advised anticoagulation as if patient had DVT.  He also advised consultation by general surgery in case of need for surgical intervention.   [AL]    Clinical Course User Index [AL] Frederica Kuster, PA-C    Patient with acute thrombosis of superior mesenteric vein and its branches involving primarily  the ileal branches although to a lesser degree the jejunal branches.  There is associated mesenteric edema and hypertensive loop of ileum.  No obstructive changes.  This was seen on CT abdomen pelvis with contrast.  Patient has no history of clots and has no history of atrial fibrillation.  EKG shows NSR today.  CBC shows WBC 12.8, hemoglobin 11.4.  CMP shows ALT 74, alk phos 148.  Lactic is within normal limits.  Patient's pain controlled with morphine and Dilaudid in the ED.  Fluids initiated.  Heparin initiated.  I consulted vascular surgery and spoke with Dr. Oneida Alar who advised heparin and treatment in a similar manner as DVT.  He advised consultation by general surgery.  I spoke with Jerene Pitch, [place], with general surgery, who will evaluate the patient in case of any necessary bowel resection.  I spoke with Dr. Claria Dice with [facility] who accepts patient for admission.  I appreciate the above consultants for their assistance with the patient.  My attending, Dr. Gilford Raid, also evaluated the patient and guided the patient's management and agrees with plan.  Patient and wife understand and agree with plan.  Final Clinical Impressions(s) / ED Diagnoses   Final diagnoses:  VTE (venous thromboembolism)  Periumbilical abdominal pain    ED Discharge Orders    None       Caryl Ada 04/03/18 1014    Isla Pence, MD 04/03/18 [address] [place], PA-C 04/12/18 1502    Isla Pence, MD 04/12/18 [phone]

## 2018-04-03 NOTE — ED Triage Notes (Signed)
Pt reports ABD pain with hx of colitis. Pt denies any blood in stool today.

## 2018-04-03 NOTE — Progress Notes (Signed)
ANTICOAGULATION CONSULT NOTE - Initial Consult  Pharmacy Consult for Heparin Indication: acute thrombosis of the superior mesenteric  Allergies  Allergen Reactions  . Penicillins Other (See Comments)    Childhood reaction.    Patient Measurements: Height: 5\' 7"  (170.2 cm) Weight: 195 lb (88.5 kg) IBW/kg (Calculated) : 66.1 Heparin Dosing Weight:  84 kg  Vital Signs: Temp: 98 F (36.7 C) (11/16 0713) Temp Source: Oral (11/16 0713) BP: 116/66 (11/16 0912) Pulse Rate: 85 (11/16 0912)  Labs: Recent Labs    04/03/18 0723 04/03/18 0732  HGB 11.4* 12.9*  HCT 38.1* 38.0*  PLT 368  --   CREATININE 1.04 1.10    Estimated Creatinine Clearance: 82.5 mL/min (by C-G formula based on SCr of 1.1 mg/dL).   Medical History: Past Medical History:  Diagnosis Date  . Asthma   . Colitis     Assessment: CC/HPI: Abdominal pain>>Superior mesenteric vein thombosis  PMH: colitis with bloody stool, asthma, ADHD, GERD, tobacco, seasonal rhinitis,   Anticoag: acute thrombosis of the superior mesenteric vein and its branches. Hgb 12  Goal of Therapy:  Heparin level 0.3-0.7 units/ml Monitor platelets by anticoagulation protocol: Yes   Plan:   Start IV heparin 4000 unit bolus Infusion at 1500 units/hr Check heparin level in 6-8 hrs Daily HL and CBC  Kenzee Bassin S. Merilynn Finlandobertson, PharmD, BCPS Clinical Staff Pharmacist Misty Stanleyobertson, Trulee Hamstra Stillinger 04/03/2018,9:30 AM

## 2018-04-03 NOTE — Consult Note (Signed)
Cameron Peters  Nov 27, 1965 [id number]  CARE TEAM:  PCP: Libby Maw, MD  Outpatient Care Team: Patient Care Team: Libby Maw, MD as PCP - General (Family Medicine)  Inpatient Treatment Team: Treatment Team: Attending Provider: Quintella Baton, MD; Registered Nurse: Marda Stalker, RN; Physician Assistant: Frederica Kuster, PA-C; Registered Nurse: Gabriel Rung, RN; Consulting Physician: Edison Pace Md, MD; Rounding Team: Dory Horn, MD   This patient is a 52 y.o.male who presents today for surgical evaluation at the request of Dr Claria Dice & Fields  Chief complaint / Reason for evaluation: SMV thrombosis & abd pain  52 year old smoking male who had abdominal pain started few days ago.  Increased in intensity.  Felt fevers and aches.  Became much more intense yesterday.  Crampy.  No nausea or vomiting.  No personal nor family history of GI/colon cancer, inflammatory bowel disease, irritable bowel syndrome, allergy such as Celiac Sprue, dietary/dairy problems, colitis, ulcers nor gastritis.  No recent sick contacts/gastroenteritis.  No travel outside the country.  No changes in diet.  No dysphagia to solids or liquids.  No significant heartburn or reflux.  No recent hematochezia, hematemesis, coffee ground emesis.  No evidence of prior gastric/peptic ulceration.  Came to emergency room.  Examination concerning.  CT scan shows obvious clot at the main part of the suberic vein thrombosis involving mainly the ileal branches.  Vascular surgery consultation requested.  Vascular surgery with general surgery involved just in case.  Wife at bedside.  Patient with significant discomfort improved with pain medication.  Already on IV heparin drip.    Assessment  Cameron Peters  52 y.o. 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  Plan:  Agree with hospitalization.  Agree with full aggressive anticoagulation.  Agree with vascular surgery consult to see if thrombectomy is needed.  General surgery will be available for back-up should need for bowel resection happen.  There is no hard indication of perforation, pneumatosis, etc. on CT scan.  While abdominal exam is concerning, would like to give a chance of anticoagulation to work before rushing to the operating room.  Certainly would want vascular surgery's input to see patient requires thrombectomy or more aggressive lysis of clot.  VTE prophylaxis- SCDs, etc  Mobilize as tolerated to help recovery  30 minutes spent in review, evaluation, examination, counseling, and coordination of care.  More than 50% of that time was spent in counseling.  Adin Hector, MD, FACS, MASCRS Gastrointestinal and Minimally Invasive Surgery    [address] [place], [place] [address] [phone] Main / Paging [phone] Fax   04/03/2018      Past Medical History:  Diagnosis Date  . Asthma   . Colitis     Past Surgical History:  Procedure Laterality Date  . CHOLECYSTECTOMY    . SHOULDER SURGERY      Social History   Socioeconomic History  . Marital status: Married    Spouse name: Not on file  . Number of children: Not on file  . Years of education: Not on file  . Highest education level: Not on file  Occupational History  . Not on file  Social Needs  . Financial  resource strain: Not on file  . Food insecurity:    Worry: Not on file    Inability: Not on file  . Transportation needs:    Medical: Not on file    Non-medical: Not on file  Tobacco Use  . Smoking status: Current Every Day Smoker    Packs/day: 1.00  . Smokeless tobacco: Never Used  Substance and Sexual Activity  . Alcohol use:  No  . Drug use: No  . Sexual activity: Not on file  Lifestyle  . Physical activity:    Days per week: Not on file    Minutes per session: Not on file  . Stress: Not on file  Relationships  . Social connections:    Talks on phone: Not on file    Gets together: Not on file    Attends religious service: Not on file    Active member of club or organization: Not on file    Attends meetings of clubs or organizations: Not on file    Relationship status: Not on file  . Intimate partner violence:    Fear of current or ex partner: Not on file    Emotionally abused: Not on file    Physically abused: Not on file    Forced sexual activity: Not on file  Other Topics Concern  . Not on file  Social History Narrative  . Not on file    No family history on file.  Current Facility-Administered Medications  Medication Dose Route Frequency Provider Last Rate Last Dose  . heparin ADULT infusion 100 units/mL (25000 units/255m sodium chloride 0.45%)  1,500 Units/hr Intravenous Continuous RKarren Cobble RPH 15 mL/hr at 04/03/18 1025 1,500 Units/hr at 04/03/18 1025  . sodium chloride 0.9 % bolus 1,000 mL  1,000 mL Intravenous Once CQuintella Baton MD       Current Outpatient Medications  Medication Sig Dispense Refill  . albuterol (VENTOLIN HFA) 108 (90 Base) MCG/ACT inhaler INHALE 2 PUFFS INTO THE LUNGS EVERY 6 HOURS AS NEEDED FOR WHEEZING OR SHORTNESS OF BREATH (Patient taking differently: Inhale 1-2 puffs into the lungs every 6 (six) hours as needed for wheezing or shortness of breath. ) 18 g 1  . amphetamine-dextroamphetamine (ADDERALL XR) 30 MG 24 hr capsule Take 1 capsule (30 mg total) by mouth as needed. (Patient taking differently: Take 30 mg by mouth daily. ) 30 capsule 0  . b complex vitamins capsule Take 1 capsule by mouth daily.    . Omega-3 1000 MG CAPS Take 1,000 mg by mouth daily.    .Marland Kitchenomega-3 acid ethyl esters (LOVAZA) 1 g capsule Take 1 g by mouth daily.    . Probiotic Product  (PROBIOTIC DAILY PO) Take 20 mg by mouth daily. 4 strain       Allergies  Allergen Reactions  . Penicillins Other (See Comments)    Childhood reaction.    ROS:   All other systems reviewed & are negative except per HPI or as noted below: Constitutional:  + fevers, chills, sweats.   Weight stable Eyes:  No vision changes, No discharge HENT:  No sore throats, nasal drainage Lymph: No neck swelling, No bruising easily Pulmonary:  No cough, productive sputum CV: No orthopnea, PND  Patient walks 20 minutes for about 1/2 miles without difficulty.  No exertional chest/neck/shoulder/arm pain. GI:  No personal nor family history of GI/colon cancer, inflammatory bowel disease, irritable bowel syndrome, allergy such as Celiac Sprue, dietary/dairy problems, colitis, ulcers nor gastritis.  No recent sick  contacts/gastroenteritis.  No travel outside the country.  No changes in diet. Renal: No UTIs, No hematuria Genital:  No drainage, bleeding, masses Musculoskeletal: No severe joint pain.  Good ROM major joints Skin:  No sores or lesions.  No rashes Heme/Lymph:  No easy bleeding.  No swollen lymph nodes Neuro: No focal weakness/numbness.  No seizures Psych: No suicidal ideation.  No hallucinations  BP (!) 106/93 (BP Location: Left Arm)   Pulse 83   Temp 98 F (36.7 C) (Oral)   Resp (!) 23   Ht _0  (1.702 m)   Wt 88.5 kg   SpO2 100%   BMI 30.54 kg/m   Physical Exam: General: Pt awake/alert/oriented x4 in moderate major acute distress Eyes: PERRL, normal EOM. Sclera nonicteric Neuro: CN II-XII intact w/o focal sensory/motor deficits. Lymph: No head/neck/groin lymphadenopathy Psych:  No delerium/psychosis/paranoia HENT: Normocephalic, Mucus membranes moist.  No thrush Neck: Supple, No tracheal deviation Chest: No pain.  Good respiratory excursion. CV:  Pulses intact.  Regular rhythm  Abdomen: Soft, mildly distended.  Obese.  Tenderness especially centrally with some guarding on the  right side.    Gen:  No inguinal hernias.  No inguinal lymphadenopathy.   Ext:  SCDs BLE.  No significant edema.  No cyanosis Skin: No petechiae / purpurea.  No major sores Musculoskeletal: No severe joint pain.  Good ROM major joints   Results:   Labs: Results for orders placed or performed during the hospital encounter of 04/03/18 (from the past 48 hour(s))  CBC with Differential     Status: Abnormal   Collection Time: 04/03/18  7:23 AM  Result Value Ref Range   WBC 12.8 (H) 4.0 - 10.5 K/uL   RBC 5.40 4.22 - 5.81 MIL/uL   Hemoglobin 11.4 (L) 13.0 - 17.0 g/dL   HCT 38.1 (L) 39.0 - 52.0 %   MCV 70.6 (L) 80.0 - 100.0 fL   MCH 21.1 (L) 26.0 - 34.0 pg   MCHC 29.9 (L) 30.0 - 36.0 g/dL   RDW 15.9 (H) 11.5 - 15.5 %   Platelets 368 150 - 400 K/uL   nRBC 0.0 0.0 - 0.2 %   Neutrophils Relative % 70 %   Neutro Abs 9.1 (H) 1.7 - 7.7 K/uL   Lymphocytes Relative 15 %   Lymphs Abs 1.9 0.7 - 4.0 K/uL   Monocytes Relative 13 %   Monocytes Absolute 1.7 (H) 0.1 - 1.0 K/uL   Eosinophils Relative 1 %   Eosinophils Absolute 0.1 0.0 - 0.5 K/uL   Basophils Relative 0 %   Basophils Absolute 0.0 0.0 - 0.1 K/uL   Immature Granulocytes 1 %   Abs Immature Granulocytes 0.07 0.00 - 0.07 K/uL    Comment: Performed at [facility], [address] [place], [place] [address]  Comprehensive metabolic panel     Status: Abnormal   Collection Time: 04/03/18  7:23 AM  Result Value Ref Range   Sodium 134 (L) 135 - 145 mmol/L   Potassium 4.1 3.5 - 5.1 mmol/L   Chloride 104 98 - 111 mmol/L   CO2 20 (L) 22 - 32 mmol/L   Glucose, Bld 94 70 - 99 mg/dL   BUN 14 6 - 20 mg/dL   Creatinine, Ser 1.04 0.61 - 1.24 mg/dL   Calcium 8.7 (L) 8.9 - 10.3 mg/dL   Total Protein 7.9 6.5 - 8.1 g/dL   Albumin 3.5 3.5 - 5.0 g/dL   AST 40 15 - 41 U/L  ALT 74 (H) 0 - 44 U/L   Alkaline Phosphatase 148 (H) 38 - 126 U/L   Total Bilirubin 0.8 0.3 - 1.2 mg/dL   GFR calc non Af Amer >60 >60 mL/min   GFR calc Af Amer >60 >60  mL/min    Comment: (NOTE) The eGFR has been calculated using the CKD EPI equation. This calculation has not been validated in all clinical situations. eGFR's persistently <60 mL/min signify possible Chronic Kidney Disease.    Anion gap 10 5 - 15    Comment: Performed at [facility] [address] [place], [place] [address]  Lipase, blood     Status: None   Collection Time: 04/03/18  7:23 AM  Result Value Ref Range   Lipase 34 11 - 51 U/L    Comment: Performed at [facility] [address], [place], [place] [address]  I-Stat CG4 Lactic Acid, ED     Status: None   Collection Time: 04/03/18  7:28 AM  Result Value Ref Range   Lactic Acid, Venous 1.64 0.5 - 1.9 mmol/L  I-stat Chem 8, ED     Status: Abnormal   Collection Time: 04/03/18  7:32 AM  Result Value Ref Range   Sodium 135 135 - 145 mmol/L   Potassium 3.9 3.5 - 5.1 mmol/L   Chloride 106 98 - 111 mmol/L   BUN 17 6 - 20 mg/dL   Creatinine, Ser 1.10 0.61 - 1.24 mg/dL   Glucose, Bld 100 (H) 70 - 99 mg/dL   Calcium, Ion 1.03 (L) 1.15 - 1.40 mmol/L   TCO2 21 (L) 22 - 32 mmol/L   Hemoglobin 12.9 (L) 13.0 - 17.0 g/dL   HCT 38.0 (L) 39.0 - 52.0 %    Imaging / Studies: Dg Chest 2 View  Result Date: 04/03/2018 CLINICAL DATA:  Shortness of breath.  Recent flu. EXAM: CHEST - 2 VIEW COMPARISON:  Chest x-ray dated August 29, 2011. FINDINGS: The heart size and mediastinal contours are within normal limits. Normal pulmonary vascularity. Mildly coarsened interstitial markings are likely smoking related. No focal consolidation, pleural effusion, or pneumothorax. No acute osseous abnormality. IMPRESSION: No active cardiopulmonary disease. Electronically Signed   By: Titus Dubin M.D.   On: 04/03/2018 08:57   Ct Abdomen Pelvis W Contrast  Result Date: 04/03/2018 CLINICAL DATA:  Generalized abdominal pain EXAM: CT ABDOMEN AND PELVIS WITH CONTRAST TECHNIQUE: Multidetector CT imaging of the abdomen and pelvis was performed  using the standard protocol following bolus administration of intravenous contrast. CONTRAST:  152m OMNIPAQUE  300 COMPARISON:  01/19/2009 FINDINGS: Lower chest: No acute abnormality. Hepatobiliary: 15 mm left hepatic cyst is noted. The gallbladder has been surgically removed. Pancreas: Unremarkable. No pancreatic ductal dilatation or surrounding inflammatory changes. Spleen: Normal in size without focal abnormality. Adrenals/Urinary Tract: Adrenal glands are within normal limits. Kidneys are well visualized bilaterally. Right renal cyst is noted which measures 4.8 cm in greatest dimension. This is increased from the prior exam. No renal calculi or obstructive changes are seen. Bladder is well distended. Stomach/Bowel: In the mid abdomen, there is a hyperdense loop of small bowel identified without definitive obstruction. Adjacent to this, there is evidence of thrombosis of the superior mesenteric vein and its branches which extends to just short of the splenoportal confluence. This involves primarily the ileal vein branches although a few of the more proximal jejunal vein branches are noted to be involved as well. The colon is well distended without focal abnormality. A few  scattered diverticula are noted. No obstructive changes are seen. The appendix is within normal limits. Vascular/Lymphatic: Aortic atherosclerosis. No enlarged abdominal or pelvic lymph nodes. Reproductive: Prostate is unremarkable. Other: No abdominal wall hernia or abnormality. No abdominopelvic ascites. Musculoskeletal: No acute or significant osseous findings. 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 Associated mesenteric edema and hyperdense loop of ileum is seen. No obstructive changes are noted. Hepatic and renal cysts. Diverticulosis without diverticulitis. Critical  Value/emergent results were called by telephone at the time of interpretation on 04/03/2018 at 9:06 am to [place], [place] , who verbally acknowledged these results. Electronically Signed   By: Inez Catalina M.D.   On: 04/03/2018 09:08    Medications / Allergies: per chart  Antibiotics: Anti-infectives (From admission, onward)   None        Note: Portions of this report may have been transcribed using voice recognition software. Every effort was made to ensure accuracy; however, inadvertent computerized transcription errors may be present.   Any transcriptional errors that result from this process are unintentional.    Adin Hector, MD, FACS, MASCRS Gastrointestinal and Minimally Invasive Surgery    [address] [place], [place] [address] [phone] Main / Paging [phone] Fax   04/03/2018

## 2018-04-04 ENCOUNTER — Encounter (HOSPITAL_COMMUNITY): Payer: Self-pay | Admitting: *Deleted

## 2018-04-04 DIAGNOSIS — R41840 Attention and concentration deficit: Secondary | ICD-10-CM

## 2018-04-04 DIAGNOSIS — Z720 Tobacco use: Secondary | ICD-10-CM

## 2018-04-04 LAB — BASIC METABOLIC PANEL
Anion gap: 6 (ref 5–15)
BUN: 12 mg/dL (ref 6–20)
CHLORIDE: 103 mmol/L (ref 98–111)
CO2: 24 mmol/L (ref 22–32)
CREATININE: 1.11 mg/dL (ref 0.61–1.24)
Calcium: 8.3 mg/dL — ABNORMAL LOW (ref 8.9–10.3)
GFR calc non Af Amer: >60 mL/min (ref >60)
GLUCOSE: 104 mg/dL — AB (ref 70–99)
Potassium: 3.9 mmol/L (ref 3.5–5.1)
Sodium: 133 mmol/L — ABNORMAL LOW (ref 135–145)

## 2018-04-04 LAB — CBC
HEMATOCRIT: 33.3 % — AB (ref 39.0–52.0)
HEMOGLOBIN: 10 g/dL — AB (ref 13.0–17.0)
MCH: 21.1 pg — ABNORMAL LOW (ref 26.0–34.0)
MCHC: 30 g/dL (ref 30.0–36.0)
MCV: 70.4 fL — ABNORMAL LOW (ref 80.0–100.0)
Platelets: 349 10*3/uL (ref 150–400)
RBC: 4.73 MIL/uL (ref 4.22–5.81)
RDW: 15.7 % — ABNORMAL HIGH (ref 11.5–15.5)
WBC: 13.2 10*3/uL — AB (ref 4.0–10.5)
nRBC: 0 % (ref 0.0–0.2)

## 2018-04-04 LAB — HEPARIN LEVEL (UNFRACTIONATED)
HEPARIN UNFRACTIONATED: 0.41 [IU]/mL (ref 0.30–0.70)
HEPARIN UNFRACTIONATED: 0.45 [IU]/mL (ref 0.30–0.70)
Heparin Unfractionated: 0.18 IU/mL — ABNORMAL LOW (ref 0.30–0.70)

## 2018-04-04 MED ORDER — METHOCARBAMOL 1000 MG/10ML IJ SOLN
1000.0000 mg | Freq: Four times a day (QID) | INTRAVENOUS | Status: DC | PRN
  Filled 2018-04-04: qty 10

## 2018-04-04 MED ORDER — GUAIFENESIN-DM 100-10 MG/5ML PO SYRP: 10.0000 mL | ORAL_SOLUTION | ORAL | Status: DC | PRN

## 2018-04-04 MED ORDER — ALUM & MAG HYDROXIDE-SIMETH 200-200-20 MG/5ML PO SUSP: 30.0000 mL | Freq: Four times a day (QID) | ORAL | Status: DC | PRN

## 2018-04-04 MED ORDER — ACETAMINOPHEN 500 MG PO TABS
1000.0000 mg | ORAL_TABLET | Freq: Three times a day (TID) | ORAL | Status: DC
  Administered 2018-04-04 – 2018-04-10 (×18): 1000 mg via ORAL
  Filled 2018-04-04 (×18): qty 2

## 2018-04-04 MED ORDER — SODIUM CHLORIDE 0.9 % IV SOLN
1.0000 g | INTRAVENOUS | Status: DC
  Administered 2018-04-04 – 2018-04-07 (×4): 1000 mg via INTRAVENOUS
  Filled 2018-04-04 (×4): qty 1

## 2018-04-04 MED ORDER — LIP MEDEX EX OINT
1.0000 "application " | TOPICAL_OINTMENT | Freq: Two times a day (BID) | CUTANEOUS | Status: DC
  Filled 2018-04-04: qty 7

## 2018-04-04 MED ORDER — GABAPENTIN 300 MG PO CAPS
300.0000 mg | ORAL_CAPSULE | Freq: Every day | ORAL | Status: AC
  Administered 2018-04-04 – 2018-04-06 (×3): 300 mg via ORAL
  Filled 2018-04-04 (×3): qty 1

## 2018-04-04 MED ORDER — LACTATED RINGERS IV BOLUS: 1000.0000 mL | Freq: Three times a day (TID) | INTRAVENOUS | Status: DC | PRN

## 2018-04-04 MED ORDER — MAGIC MOUTHWASH: 15.0000 mL | Freq: Four times a day (QID) | ORAL | Status: DC | PRN

## 2018-04-04 MED ORDER — HEPARIN BOLUS VIA INFUSION
3000.0000 [IU] | Freq: Once | INTRAVENOUS | Status: AC
  Administered 2018-04-04: 3000 [IU] via INTRAVENOUS
  Filled 2018-04-04: qty 3000

## 2018-04-04 MED ORDER — BISACODYL 10 MG RE SUPP: 10.0000 mg | Freq: Two times a day (BID) | RECTAL | Status: DC | PRN

## 2018-04-04 MED ORDER — ACETAMINOPHEN 650 MG RE SUPP: 650.0000 mg | Freq: Four times a day (QID) | RECTAL | Status: DC | PRN

## 2018-04-04 MED ORDER — HYDROMORPHONE HCL 1 MG/ML IJ SOLN
0.5000 mg | INTRAMUSCULAR | Status: DC | PRN
  Administered 2018-04-04 (×2): 2 mg via INTRAVENOUS
  Administered 2018-04-05: 1 mg via INTRAVENOUS
  Filled 2018-04-04 (×4): qty 2

## 2018-04-04 MED ORDER — PHENOL 1.4 % MT LIQD: 1.0000 | OROMUCOSAL | Status: DC | PRN

## 2018-04-04 MED ORDER — MENTHOL 3 MG MT LOZG: 1.0000 | LOZENGE | OROMUCOSAL | Status: DC | PRN

## 2018-04-04 MED ORDER — HYDROCORTISONE 2.5 % RE CREA: 1.0000 "application " | TOPICAL_CREAM | Freq: Four times a day (QID) | RECTAL | Status: DC | PRN

## 2018-04-04 MED ORDER — PROCHLORPERAZINE EDISYLATE 10 MG/2ML IJ SOLN: 5.0000 mg | INTRAMUSCULAR | Status: DC | PRN

## 2018-04-04 MED ORDER — HYDROCORTISONE 1 % EX CREA: 1.0000 "application " | TOPICAL_CREAM | Freq: Three times a day (TID) | CUTANEOUS | Status: DC | PRN

## 2018-04-04 NOTE — Progress Notes (Signed)
ANTICOAGULATION CONSULT NOTE - Follow Up Consult  Pharmacy Consult for heparin Indication: acute thrombosis of superior mesenteric vein  Labs: Recent Labs    04/03/18 0723 04/03/18 0732 04/03/18 0929 04/03/18 1627 04/04/18 0110  HGB 11.4* 12.9*  --   --  10.0*  HCT 38.1* 38.0*  --   --  33.3*  PLT 368  --   --   --  349  APTT  --   --  39*  --   --   LABPROT  --   --  14.1  --   --   INR  --   --  1.10  --   --   HEPARINUNFRC  --   --   --  <0.10* 0.18*  CREATININE 1.04 1.10  --   --   --     Assessment: 52yo male remains subtherapeutic on heparin after rate increase; RN notes no gtt issues or overt signs of bleeding, noted 2pt drop in Hgb.  Goal of Therapy:  Heparin level 0.3-0.7 units/ml   Plan:  Will rebolus with heparin 3000 units and increase heparin gtt by 3-4 units/kg/hr to 2100 units/hr and check level in 6 hours.    Vernard GamblesVeronda Naylee Frankowski, PharmD, BCPS  04/04/2018,1:56 AM

## 2018-04-04 NOTE — Progress Notes (Addendum)
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  Allergies  Allergen Reactions  . Penicillins Other (See Comments)    Childhood reaction - was told by parent a long time ago    Patient Measurements: Height: 5\' 9"  (175.3 cm) Weight: 195 lb (88.5 kg) IBW/kg (Calculated) : 70.7 Heparin Dosing Weight: 84 kg  Vital Signs: Temp: 99.9 F (37.7 C) (11/17 0509) Temp Source: Oral (11/17 0509) BP: 112/79 (11/17 0509) Pulse Rate: 88 (11/17 0509)  Labs: Recent Labs    04/03/18 0723 04/03/18 0732 04/03/18 0929 04/03/18 1627 04/04/18 0110 04/04/18 0734  HGB 11.4* 12.9*  --   --  10.0*  --   HCT 38.1* 38.0*  --   --  33.3*  --   PLT 368  --   --   --  349  --   APTT  --   --  39*  --   --   --   LABPROT  --   --  14.1  --   --   --   INR  --   --  1.10  --   --   --   HEPARINUNFRC  --   --   --  <0.10* 0.18* 0.41  CREATININE 1.04 1.10  --   --  1.11  --     Estimated Creatinine Clearance: 84.7 mL/min (by C-G formula based on SCr of 1.11 mg/dL).  Assessment:  Anticoag: acute thrombosis of the superior mesenteric vein and its branches. Hgb 12.9>10.  Plts stable. Heparin level 0.41  now in goal range.  Goal of Therapy:  Heparin level 0.3-0.7 units/ml Monitor platelets by anticoagulation protocol: Yes   Plan:  Heparin infusion at 2100 units/hr Check heparin level in 6-8 hrs to confirm Daily HL and CBC  Addendum 1444: Heparin level 0.45 remains in goal range. Continue same Heparin 2100 units/hr   Seren Chaloux S. Merilynn Finlandobertson, PharmD, BCPS Clinical Staff Pharmacist Misty Stanleyobertson, Nial Hawe Stillinger 04/04/2018,9:04 AM

## 2018-04-04 NOTE — Progress Notes (Addendum)
[facility] PROGRESS NOTE  Cameron Peters WUJ:[id number] DOB: 18-Jul-1965 DOA: 04/03/2018  PCP: Mliss Sax, MD  Brief History/Interval Summary: 52 y/o male with a past medical history of colitis previously presented with abdominal pain.  He underwent CT scan of the abdomen which SMV thrombosis.  Patient was hospitalized for further management.    Reason for Visit: SMV thrombosis  Consultants: Phone discussion with vascular surgery by ED providers.  General surgery.  Procedures: None  Antibiotics: Levaquin changed over to Invanz today  Subjective/Interval History: Patient continues to have 8-10 out of 10 pain in the abdomen.  Denies any nausea vomiting this morning.  Had a bowel movement which was loose.  Denies any blood in the stool.  ROS: Denies chest pain or shortness of breath  Objective:  Vital Signs  Vitals:   04/03/18 1156 04/03/18 1400 04/03/18 2028 04/04/18 0509  BP: 124/74 111/67 137/64 112/79  Pulse: 84 83 85 88  Resp: (!) 22  16 16   Temp: 99.1 F (37.3 C) 98.6 F (37 C) (!) 100.6 F (38.1 C) 99.9 F (37.7 C)  TempSrc: Oral Oral Oral Oral  SpO2: 98% 99% 99% 97%  Weight: 88.5 kg     Height: 5\' 9"  (1.753 m)       Intake/Output Summary (Last 24 hours) at 04/04/2018 1039 Last data filed at 04/04/2018 0900 Gross per 24 hour  Intake 2391.25 ml  Output -  Net 2391.25 ml   Filed Weights   04/03/18 0715 04/03/18 1156  Weight: 88.5 kg 88.5 kg    General appearance: alert, cooperative, appears stated age and no distress Resp: Normal effort at rest.  Coarse breath sounds bilaterally.   Cardio: regular rate and rhythm, S1, S2 normal, no murmur, click, rub or gallop GI: Abdomen soft.  Very mildly tender in the mid abdomen without any rebound rigidity or guarding.  No masses organomegaly.  Bowel sounds are present. Extremities: extremities normal, atraumatic, no cyanosis or edema Neurologic: No obvious focal neurological deficits  appreciated  Lab Results:  Data Reviewed: I have personally reviewed following labs and imaging studies  CBC: Recent Labs  Lab 04/03/18 0723 04/03/18 0732 04/04/18 0110  WBC 12.8*  --  13.2*  NEUTROABS 9.1*  --   --   HGB 11.4* 12.9* 10.0*  HCT 38.1* 38.0* 33.3*  MCV 70.6*  --  70.4*  PLT 368  --  349    Basic Metabolic Panel: Recent Labs  Lab 04/03/18 0723 04/03/18 0732 04/04/18 0110  NA 134* 135 133*  K 4.1 3.9 3.9  CL 104 106 103  CO2 20*  --  24  GLUCOSE 94 100* 104*  BUN 14 17 12   CREATININE 1.04 1.10 1.11  CALCIUM 8.7*  --  8.3*    GFR: Estimated Creatinine Clearance: 84.7 mL/min (by C-G formula based on SCr of 1.11 mg/dL).  Liver Function Tests: Recent Labs  Lab 04/03/18 0723  AST 40  ALT 74*  ALKPHOS 148*  BILITOT 0.8  PROT 7.9  ALBUMIN 3.5    Recent Labs  Lab 04/03/18 0723  LIPASE 34   Coagulation Profile: Recent Labs  Lab 04/03/18 0929  INR 1.10    Lipid Profile: Recent Labs    04/03/18 1142  CHOL 122  HDL 15*  LDLCALC 84  TRIG 956  CHOLHDL 8.1      Radiology Studies: Dg Chest 2 View  Result Date: 04/03/2018 CLINICAL DATA:  Shortness of breath.  Recent flu. EXAM: CHEST - 2 VIEW  COMPARISON:  Chest x-ray dated August 29, 2011. FINDINGS: The heart size and mediastinal contours are within normal limits. Normal pulmonary vascularity. Mildly coarsened interstitial markings are likely smoking related. No focal consolidation, pleural effusion, or pneumothorax. No acute osseous abnormality. IMPRESSION: No active cardiopulmonary disease. Electronically Signed   By: Obie DredgeWilliam T Derry M.D.   On: 04/03/2018 08:57   Ct Abdomen Pelvis W Contrast  Result Date: 04/03/2018 CLINICAL DATA:  Generalized abdominal pain EXAM: CT ABDOMEN AND PELVIS WITH CONTRAST TECHNIQUE: Multidetector CT imaging of the abdomen and pelvis was performed using the standard protocol following bolus administration of intravenous contrast. CONTRAST:  100mL OMNIPAQUE   300 COMPARISON:  01/19/2009 FINDINGS: Lower chest: No acute abnormality. Hepatobiliary: 15 mm left hepatic cyst is noted. The gallbladder has been surgically removed. Pancreas: Unremarkable. No pancreatic ductal dilatation or surrounding inflammatory changes. Spleen: Normal in size without focal abnormality. Adrenals/Urinary Tract: Adrenal glands are within normal limits. Kidneys are well visualized bilaterally. Right renal cyst is noted which measures 4.8 cm in greatest dimension. This is increased from the prior exam. No renal calculi or obstructive changes are seen. Bladder is well distended. Stomach/Bowel: In the mid abdomen, there is a hyperdense loop of small bowel identified without definitive obstruction. Adjacent to this, there is evidence of thrombosis of the superior mesenteric vein and its branches which extends to just short of the splenoportal confluence. This involves primarily the ileal vein branches although a few of the more proximal jejunal vein branches are noted to be involved as well. The colon is well distended without focal abnormality. A few scattered diverticula are noted. No obstructive changes are seen. The appendix is within normal limits. Vascular/Lymphatic: Aortic atherosclerosis. No enlarged abdominal or pelvic lymph nodes. Reproductive: Prostate is unremarkable. Other: No abdominal wall hernia or abnormality. No abdominopelvic ascites. Musculoskeletal: No acute or significant osseous findings. 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 Associated mesenteric edema and hyperdense loop of ileum is seen. No obstructive changes are noted. Hepatic and renal cysts. Diverticulosis without diverticulitis. Critical Value/emergent results were called by telephone at the time of interpretation on 04/03/2018 at 9:06 am to [facility] LLCEXANDRA LAW,  PA , who verbally acknowledged these results. Electronically Signed   By: Alcide CleverMark  Lukens M.D.   On: 04/03/2018 09:08     Medications:  Scheduled: . acetaminophen  1,000 mg Oral TID  . gabapentin  300 mg Oral QHS  . nicotine  14 mg Transdermal Daily   Continuous: . 0.9 % NaCl with KCl 20 mEq / L 75 mL/hr at 04/04/18 0805  . ertapenem    . heparin 2,100 Units/hr (04/04/18 0216)  . lactated ringers    . methocarbamol (ROBAXIN) IV     WUJ:[id number]:acetaminophen, alum & mag hydroxide-simeth, bisacodyl, guaiFENesin-dextromethorphan, hydrocortisone, hydrocortisone cream, HYDROmorphone (DILAUDID) injection, lactated ringers, magic mouthwash, menthol-cetylpyridinium, methocarbamol (ROBAXIN) IV, ondansetron **OR** ondansetron (ZOFRAN) IV, phenol, prochlorperazine  Assessment/Plan:    Acute SMV thrombosis No previous history of any DVT or PE.  No family history of same.  Reason for this presentation not entirely clear.  Hypercoagulable panel has been sent.  Patient without any known history of cancer.  This was discussed with Dr. Arbie CookeyEarly with vascular surgery who recommended treating this as if it was a DVT and anticoagulating this patient.  General surgery is following in case there is any compromise to the bowels.  Lactic acid level  was normal.  LDL 84.  Patient is empirically on ertapenem.  He remains n.p.o.  Pain management.  Continue heparin intravenously.  History of tobacco abuse Nicotine patch  Possible history of ADD Patient noted to be on Adderall at home.  This is currently on hold.  Normocytic anemia Check anemia panel in the morning.  No evidence for overt bleeding.   DVT Prophylaxis: On IV heparin    Code Status: Full code Family Communication: Discussed with the patient.  No family at bedside Disposition Plan: Patient lives with his wife.  He is noted to be ambulating in the room.  Continue management as outlined above.    LOS: 1 day   Osvaldo Shipper  [facility] Pager  [phone] 04/04/2018, 10:39 AM  If 7PM-7AM, please contact night-coverage at [email or web address], password [facility]

## 2018-04-04 NOTE — Progress Notes (Signed)
Cameron Peters [id number] 1965-12-03  CARE TEAM:  PCP: Libby Maw, MD  Outpatient Care Team: Patient Care Team: Libby Maw, MD as PCP - General (Family Medicine)  Inpatient Treatment Team: Treatment Team: Attending Provider: Bonnielee Haff, MD; Consulting Physician: Edison Pace, Md, MD; Rounding Team: Sharyon Medicus, MD; Registered Nurse: Samantha Crimes, RN; Consulting Physician: Elam Dutch, MD   Problem List:   Principal Problem:   Septic thrombophlebitis of superior mesenteric vein Active Problems:   Attention and concentration deficit   Gastroesophageal reflux disease   Tobacco use   Reactive airway disease   Asthma   Mesenteric vein thrombosis      * No surgery found *      Assessment  FAIR with significant crampy abdominal pain due to mesenteric vein thrombosis.  [facility] Stay = 1 days)  Plan:  -bowel rest - ice chips -Anticoagulation with heparin drip.  Trying to get therapeutic -IV antibiotics.  Usually we do IV Zosyn.  His allergy to penicillin seems questionable.  He does not recall.  Would switch to Invanz as that is better intra-abdominal coverage then Levaquin.  Some baseline pain control orally as tolerated.  Thrombectomy or further intervention per vascular surgery.  Believe the plan is to continue anticoagulation.  Mobilize as tolerated to help recovery -most likely can only tolerate bedrest for now.  The patient is hemodynamically stable.  There is no evidence of peritonitis, acute abdomen, nor shock.  There is no strong evidence of failure of improvement nor decline with current non-operative management.  There is no need for surgery at the present moment.  Should he decline further, may require operative intervention to rule out bowel ischemia or necrosis.  Awaiting vascular surgery input.  We will continue to follow.   20 minutes spent in review, evaluation, examination, counseling, and coordination of care.  More  than 50% of that time was spent in counseling.  04/04/2018    Subjective: (Chief complaint)  Still with significant abdominal pain but better controlled.  No nausea or vomiting.  Objective:  Vital signs:  Vitals:   04/03/18 1156 04/03/18 1400 04/03/18 2028 04/04/18 0509  BP: 124/74 111/67 137/64 112/79  Pulse: 84 83 85 88  Resp: (!) '22  16 16  ' Temp: 99.1 F (37.3 C) 98.6 F (37 C) (!) 100.6 F (38.1 C) 99.9 F (37.7 C)  TempSrc: Oral Oral Oral Oral  SpO2: 98% 99% 99% 97%  Weight: 88.5 kg     Height: '5\' 9"'  (1.753 m)       Last BM Date: 04/03/18  Intake/Output   Yesterday:  11/16 0701 - 11/17 0700 In: 68.8 [I.V.:68.8] Out: -  This shift:  No intake/output data recorded.  Bowel function:  Flatus: YES  BM:  No  Drain: (No drain)   Physical Exam:  General: Pt awake/alert/oriented x4 in moderate acute distress Eyes: PERRL, normal EOM.  Sclera clear.  No icterus Neuro: CN II-XII intact w/o focal sensory/motor deficits. Lymph: No head/neck/groin lymphadenopathy Psych:  No delerium/psychosis/paranoia HENT: Normocephalic, Mucus membranes moist.  No thrush Neck: Supple, No tracheal deviation Chest: No chest wall pain w good excursion CV:  Pulses intact.  Regular rhythm MS: Normal AROM mjr joints.  No obvious deformity  Abdomen: Obese but Soft.  Mildy distended.  Moderately intense tenderness in mid abdomen.  Less guarding today..  No evidence of peritonitis.  No incarcerated hernias.  Ext:  No deformity.  No mjr edema.  No cyanosis Skin: No petechiae / purpura  Results:   Labs: Results for orders placed or performed during the hospital encounter of 04/03/18 (from the past 48 hour(s))  CBC with Differential     Status: Abnormal   Collection Time: 04/03/18  7:23 AM  Result Value Ref Range   WBC 12.8 (H) 4.0 - 10.5 K/uL   RBC 5.40 4.22 - 5.81 MIL/uL   Hemoglobin 11.4 (L) 13.0 - 17.0 g/dL   HCT 38.1 (L) 39.0 - 52.0 %   MCV 70.6 (L) 80.0 - 100.0 fL    MCH 21.1 (L) 26.0 - 34.0 pg   MCHC 29.9 (L) 30.0 - 36.0 g/dL   RDW 15.9 (H) 11.5 - 15.5 %   Platelets 368 150 - 400 K/uL   nRBC 0.0 0.0 - 0.2 %   Neutrophils Relative % 70 %   Neutro Abs 9.1 (H) 1.7 - 7.7 K/uL   Lymphocytes Relative 15 %   Lymphs Abs 1.9 0.7 - 4.0 K/uL   Monocytes Relative 13 %   Monocytes Absolute 1.7 (H) 0.1 - 1.0 K/uL   Eosinophils Relative 1 %   Eosinophils Absolute 0.1 0.0 - 0.5 K/uL   Basophils Relative 0 %   Basophils Absolute 0.0 0.0 - 0.1 K/uL   Immature Granulocytes 1 %   Abs Immature Granulocytes 0.07 0.00 - 0.07 K/uL    Comment: Performed at [facility], [address], [place], [place] [address]  Comprehensive metabolic panel     Status: Abnormal   Collection Time: 04/03/18  7:23 AM  Result Value Ref Range   Sodium 134 (L) 135 - 145 mmol/L   Potassium 4.1 3.5 - 5.1 mmol/L   Chloride 104 98 - 111 mmol/L   CO2 20 (L) 22 - 32 mmol/L   Glucose, Bld 94 70 - 99 mg/dL   BUN 14 6 - 20 mg/dL   Creatinine, Ser 1.04 0.61 - 1.24 mg/dL   Calcium 8.7 (L) 8.9 - 10.3 mg/dL   Total Protein 7.9 6.5 - 8.1 g/dL   Albumin 3.5 3.5 - 5.0 g/dL   AST 40 15 - 41 U/L   ALT 74 (H) 0 - 44 U/L   Alkaline Phosphatase 148 (H) 38 - 126 U/L   Total Bilirubin 0.8 0.3 - 1.2 mg/dL   GFR calc non Af Amer >60 >60 mL/min   GFR calc Af Amer >60 >60 mL/min    Comment: (NOTE) The eGFR has been calculated using the CKD EPI equation. This calculation has not been validated in all clinical situations. eGFR's persistently <60 mL/min signify possible Chronic Kidney Disease.    Anion gap 10 5 - 15    Comment: Performed at [facility] [address], [place], [place] [address]  Lipase, blood     Status: None   Collection Time: 04/03/18  7:23 AM  Result Value Ref Range   Lipase 34 11 - 51 U/L    Comment: Performed at [facility] [address] [place], [place] [address]  I-Stat CG4 Lactic Acid, ED     Status: None   Collection Time: 04/03/18  7:28 AM  Result  Value Ref Range   Lactic Acid, Venous 1.64 0.5 - 1.9 mmol/L  I-stat Chem 8, ED     Status: Abnormal   Collection Time: 04/03/18  7:32 AM  Result Value Ref Range   Sodium 135 135 - 145 mmol/L   Potassium 3.9 3.5 - 5.1 mmol/L   Chloride 106 98 - 111 mmol/L  BUN 17 6 - 20 mg/dL   Creatinine, Ser 1.10 0.61 - 1.24 mg/dL   Glucose, Bld 100 (H) 70 - 99 mg/dL   Calcium, Ion 1.03 (L) 1.15 - 1.40 mmol/L   TCO2 21 (L) 22 - 32 mmol/L   Hemoglobin 12.9 (L) 13.0 - 17.0 g/dL   HCT 38.0 (L) 39.0 - 52.0 %  Protime-INR     Status: None   Collection Time: 04/03/18  9:29 AM  Result Value Ref Range   Prothrombin Time 14.1 11.4 - 15.2 seconds   INR 1.10     Comment: Performed at [facility], [place] [address] [place], [place] [address]  APTT     Status: Abnormal   Collection Time: 04/03/18  9:29 AM  Result Value Ref Range   aPTT 39 (H) 24 - 36 seconds    Comment:        IF BASELINE aPTT IS ELEVATED, SUGGEST PATIENT RISK ASSESSMENT BE USED TO DETERMINE APPROPRIATE ANTICOAGULANT THERAPY. Performed at [facility], [place] [address], [place], [place] [address]   Antithrombin III     Status: None   Collection Time: 04/03/18  9:29 AM  Result Value Ref Range   AntiThromb III Func 79 75 - 120 %    Comment: Performed at [facility] [address], [place], [place] [address]  PSA     Status: None   Collection Time: 04/03/18 11:42 AM  Result Value Ref Range   Prostatic Specific Antigen 0.40 0.00 - 4.00 ng/mL    Comment: (NOTE) While PSA levels of <=4.0 ng/ml are reported as reference range, some men with levels below 4.0 ng/ml can have prostate cancer and many men with PSA above 4.0 ng/ml do not have prostate cancer.  Other tests such as free PSA, age specific reference ranges, PSA velocity and PSA doubling time may be helpful especially in men less than 65 years old. Performed at [facility], [place] [address] [place],  [address]   Lipid panel     Status: Abnormal    Collection Time: 04/03/18 11:42 AM  Result Value Ref Range   Cholesterol 122 0 - 200 mg/dL   Triglycerides 115 <150 mg/dL   HDL 15 (L) >40 mg/dL   Total CHOL/HDL Ratio 8.1 RATIO   VLDL 23 0 - 40 mg/dL   LDL Cholesterol 84 0 - 99 mg/dL    Comment:        Total Cholesterol/HDL:CHD Risk Coronary Heart Disease Risk Table                     Men   Women  1/2 Average Risk   3.4   3.3  Average Risk       5.0   4.4  2 X Average Risk   9.6   7.1  3 X Average Risk  23.4   11.0        Use the calculated Patient Ratio above and the CHD Risk Table to determine the patient's CHD Risk.        ATP III CLASSIFICATION (LDL):  <100     mg/dL   Optimal  100-129  mg/dL   Near or Above                    Optimal  130-159  mg/dL   Borderline  160-189  mg/dL   High  >190     mg/dL   Very High Performed  at [facility], [place] [address] [place], [place] [address]   Heparin level (unfractionated)     Status: Abnormal   Collection Time: 04/03/18  4:27 PM  Result Value Ref Range   Heparin Unfractionated <0.10 (L) 0.30 - 0.70 IU/mL    Comment: (NOTE) If heparin results are below expected values, and patient dosage has  been confirmed, suggest follow up testing of antithrombin III levels. Performed at [facility], [place] [address], [place], [place] [address]   CBC     Status: Abnormal   Collection Time: 04/04/18  1:10 AM  Result Value Ref Range   WBC 13.2 (H) 4.0 - 10.5 K/uL   RBC 4.73 4.22 - 5.81 MIL/uL   Hemoglobin 10.0 (L) 13.0 - 17.0 g/dL    Comment: REPEATED TO VERIFY   HCT 33.3 (L) 39.0 - 52.0 %   MCV 70.4 (L) 80.0 - 100.0 fL   MCH 21.1 (L) 26.0 - 34.0 pg   MCHC 30.0 30.0 - 36.0 g/dL   RDW 15.7 (H) 11.5 - 15.5 %   Platelets 349 150 - 400 K/uL   nRBC 0.0 0.0 - 0.2 %    Comment: Performed at [facility] [address], [place], [place] [address]  Basic metabolic panel     Status: Abnormal   Collection Time: 04/04/18  1:10 AM  Result Value Ref Range   Sodium 133 (L)  135 - 145 mmol/L   Potassium 3.9 3.5 - 5.1 mmol/L   Chloride 103 98 - 111 mmol/L   CO2 24 22 - 32 mmol/L   Glucose, Bld 104 (H) 70 - 99 mg/dL   BUN 12 6 - 20 mg/dL   Creatinine, Ser 1.11 0.61 - 1.24 mg/dL   Calcium 8.3 (L) 8.9 - 10.3 mg/dL   GFR calc non Af Amer >60 >60 mL/min   GFR calc Af Amer >60 >60 mL/min    Comment: (NOTE) The eGFR has been calculated using the CKD EPI equation. This calculation has not been validated in all clinical situations. eGFR's persistently <60 mL/min signify possible Chronic Kidney Disease.    Anion gap 6 5 - 15    Comment: Performed at [facility] [address] [place], [place] [address]  Heparin level (unfractionated)     Status: Abnormal   Collection Time: 04/04/18  1:10 AM  Result Value Ref Range   Heparin Unfractionated 0.18 (L) 0.30 - 0.70 IU/mL    Comment: (NOTE) If heparin results are below expected values, and patient dosage has  been confirmed, suggest follow up testing of antithrombin III levels. Performed at [facility], [place] [address] [place], [place] [address]     Imaging / Studies: Dg Chest 2 View  Result Date: 04/03/2018 CLINICAL DATA:  Shortness of breath.  Recent flu. EXAM: CHEST - 2 VIEW COMPARISON:  Chest x-ray dated August 29, 2011. FINDINGS: The heart size and mediastinal contours are within normal limits. Normal pulmonary vascularity. Mildly coarsened interstitial markings are likely smoking related. No focal consolidation, pleural effusion, or pneumothorax. No acute osseous abnormality. IMPRESSION: No active cardiopulmonary disease. Electronically Signed   By: Titus Dubin M.D.   On: 04/03/2018 08:57   Ct Abdomen Pelvis W Contrast  Result Date: 04/03/2018 CLINICAL DATA:  Generalized abdominal pain EXAM: CT ABDOMEN AND PELVIS WITH CONTRAST TECHNIQUE: Multidetector CT imaging of the abdomen and pelvis was performed using the standard protocol following bolus administration of intravenous contrast.  CONTRAST:  164m OMNIPAQUE  300 COMPARISON:  01/19/2009 FINDINGS: Lower chest: No acute abnormality. Hepatobiliary: 15 mm left hepatic cyst is noted. The gallbladder has been surgically removed. Pancreas: Unremarkable. No pancreatic ductal dilatation or surrounding inflammatory changes. Spleen: Normal in size without focal abnormality. Adrenals/Urinary Tract: Adrenal glands are within normal limits. Kidneys are well visualized bilaterally. Right renal cyst is noted which measures 4.8 cm in greatest dimension. This is increased from the prior exam. No renal calculi or obstructive changes are seen. Bladder is well distended. Stomach/Bowel: In the mid abdomen, there is a hyperdense loop of small bowel identified without definitive obstruction. Adjacent to this, there is evidence of thrombosis of the superior mesenteric vein and its branches which extends to just short of the splenoportal confluence. This involves primarily the ileal vein branches although a few of the more proximal jejunal vein branches are noted to be involved as well. The colon is well distended without focal abnormality. A few scattered diverticula are noted. No obstructive changes are seen. The appendix is within normal limits. Vascular/Lymphatic: Aortic atherosclerosis. No enlarged abdominal or pelvic lymph nodes. Reproductive: Prostate is unremarkable. Other: No abdominal wall hernia or abnormality. No abdominopelvic ascites. Musculoskeletal: No acute or significant osseous findings. 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 Associated mesenteric edema and hyperdense loop of ileum is seen. No obstructive changes are noted. Hepatic and renal cysts. Diverticulosis without diverticulitis. Critical Value/emergent results were called by telephone at the time of interpretation on 04/03/2018 at  9:06 am to [place] PA , who verbally acknowledged these results. Electronically Signed   By: MInez CatalinaM.D.   On: 04/03/2018 09:08    Medications / Allergies: per chart  Antibiotics: Anti-infectives (From admission, onward)   Start     Dose/Rate Route Frequency Ordered Stop   04/04/18 0800  ertapenem (INVANZ) 1,000 mg in sodium chloride 0.9 % 100 mL IVPB     1 g 200 mL/hr over 30 Minutes Intravenous Every 24 hours 04/04/18 0755     04/03/18 1230  levofloxacin (LEVAQUIN) IVPB 750 mg  Status:  Discontinued     750 mg 100 mL/hr over 90 Minutes Intravenous Every 24 hours 04/03/18 1137 04/04/18 0755        Note: Portions of this report may have been transcribed using voice recognition software. Every effort was made to ensure accuracy; however, inadvertent computerized transcription errors may be present.   Any transcriptional errors that result from this process are unintentional.     SAdin Hector MD, FACS, MASCRS Gastrointestinal and Minimally Invasive Surgery    [address] [place] [phone] / Paging [phone]

## 2018-04-05 ENCOUNTER — Other Ambulatory Visit (HOSPITAL_COMMUNITY): Payer: Managed Care, Other (non HMO)

## 2018-04-05 LAB — CBC
HCT: 30.5 % — ABNORMAL LOW (ref 39.0–52.0)
Hemoglobin: 9.1 g/dL — ABNORMAL LOW (ref 13.0–17.0)
MCH: 21.2 pg — AB (ref 26.0–34.0)
MCHC: 29.8 g/dL — ABNORMAL LOW (ref 30.0–36.0)
MCV: 71.1 fL — AB (ref 80.0–100.0)
NRBC: 0 % (ref 0.0–0.2)
PLATELETS: 354 10*3/uL (ref 150–400)
RBC: 4.29 MIL/uL (ref 4.22–5.81)
RDW: 16 % — AB (ref 11.5–15.5)
WBC: 11.7 10*3/uL — ABNORMAL HIGH (ref 4.0–10.5)

## 2018-04-05 LAB — BASIC METABOLIC PANEL
Anion gap: 8 (ref 5–15)
BUN: 14 mg/dL (ref 6–20)
CALCIUM: 8.3 mg/dL — AB (ref 8.9–10.3)
CO2: 21 mmol/L — AB (ref 22–32)
CREATININE: 1 mg/dL (ref 0.61–1.24)
Chloride: 106 mmol/L (ref 98–111)
GFR calc Af Amer: >60 mL/min (ref >60)
GLUCOSE: 81 mg/dL (ref 70–99)
Potassium: 4.1 mmol/L (ref 3.5–5.1)
Sodium: 135 mmol/L (ref 135–145)

## 2018-04-05 LAB — IRON AND TIBC
Iron: 13 ug/dL — ABNORMAL LOW (ref 45–182)
Saturation Ratios: 4 % — ABNORMAL LOW (ref 17.9–39.5)
TIBC: 329 ug/dL (ref 250–450)
UIBC: 316 ug/dL

## 2018-04-05 LAB — HEPARIN LEVEL (UNFRACTIONATED): HEPARIN UNFRACTIONATED: 0.38 [IU]/mL (ref 0.30–0.70)

## 2018-04-05 LAB — CARDIOLIPIN ANTIBODIES, IGG, IGM, IGA
Anticardiolipin IgA: <9 APL U/mL (ref 0–11)
Anticardiolipin IgG: <9 GPL U/mL (ref 0–14)
Anticardiolipin IgM: <9 MPL U/mL (ref 0–12)

## 2018-04-05 LAB — PROTEIN C ACTIVITY: Protein C Activity: 56 % — ABNORMAL LOW (ref 73–180)

## 2018-04-05 LAB — PROTEIN S, TOTAL: Protein S Ag, Total: 56 % — ABNORMAL LOW (ref 60–150)

## 2018-04-05 LAB — RETICULOCYTES
IMMATURE RETIC FRACT: 11.7 % (ref 2.3–15.9)
RBC.: 4.29 MIL/uL (ref 4.22–5.81)
RETIC COUNT ABSOLUTE: 14.6 10*3/uL — AB (ref 19.0–186.0)
RETIC CT PCT: 0.3 % — AB (ref 0.4–3.1)

## 2018-04-05 LAB — BETA-2-GLYCOPROTEIN I ABS, IGG/M/A
Beta-2 Glyco I IgG: <9 GPI IgG units (ref 0–20)
Beta-2-Glycoprotein I IgA: <9 GPI IgA units (ref 0–25)
Beta-2-Glycoprotein I IgM: <9 GPI IgM units (ref 0–32)

## 2018-04-05 LAB — HOMOCYSTEINE: Homocysteine: 9.6 umol/L (ref 0.0–15.0)

## 2018-04-05 LAB — FERRITIN: Ferritin: 145 ng/mL (ref 24–336)

## 2018-04-05 LAB — VITAMIN B12: Vitamin B-12: 466 pg/mL (ref 180–914)

## 2018-04-05 LAB — FOLATE: Folate: 24.5 ng/mL (ref >5.9)

## 2018-04-05 LAB — PROTEIN S ACTIVITY: Protein S Activity: 88 % (ref 63–140)

## 2018-04-05 MED ORDER — BOOST / RESOURCE BREEZE PO LIQD CUSTOM
1.0000 | Freq: Three times a day (TID) | ORAL | Status: DC
  Administered 2018-04-05 – 2018-04-07 (×5): 1 via ORAL
  Administered 2018-04-07: 237 mL via ORAL
  Administered 2018-04-08 – 2018-04-09 (×2): 1 via ORAL

## 2018-04-05 MED ORDER — OXYCODONE HCL 5 MG PO TABS
5.0000 mg | ORAL_TABLET | ORAL | Status: DC | PRN
  Administered 2018-04-05 – 2018-04-09 (×9): 10 mg via ORAL
  Filled 2018-04-05 (×9): qty 2

## 2018-04-05 MED ORDER — HYDROMORPHONE HCL 1 MG/ML IJ SOLN
0.5000 mg | INTRAMUSCULAR | Status: DC | PRN
  Administered 2018-04-05 (×3): 2 mg via INTRAVENOUS
  Administered 2018-04-06 – 2018-04-07 (×4): 1 mg via INTRAVENOUS
  Administered 2018-04-07: 2 mg via INTRAVENOUS
  Filled 2018-04-05 (×4): qty 1
  Filled 2018-04-05 (×2): qty 2
  Filled 2018-04-05 (×2): qty 1
  Filled 2018-04-05: qty 2

## 2018-04-05 NOTE — Progress Notes (Signed)
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  Allergies  Allergen Reactions  . Penicillins Other (See Comments)    Childhood reaction - was told by parent a long time ago    Patient Measurements: Height: 5\' 9"  (175.3 cm) Weight: 195 lb (88.5 kg) IBW/kg (Calculated) : 70.7 Heparin Dosing Weight: 84 kg  Vital Signs: Temp: 98.4 F (36.9 C) (11/18 0541) Temp Source: Oral (11/18 0541) BP: 123/74 (11/18 0541) Pulse Rate: 85 (11/18 0541)  Labs: Recent Labs    04/03/18 0723 04/03/18 0732 04/03/18 0929  04/04/18 0110 04/04/18 0734 04/04/18 1353 04/05/18 0120  HGB 11.4* 12.9*  --   --  10.0*  --   --  9.1*  HCT 38.1* 38.0*  --   --  33.3*  --   --  30.5*  PLT 368  --   --   --  349  --   --  354  APTT  --   --  39*  --   --   --   --   --   LABPROT  --   --  14.1  --   --   --   --   --   INR  --   --  1.10  --   --   --   --   --   HEPARINUNFRC  --   --   --    < > 0.18* 0.41 0.45 0.38  CREATININE 1.04 1.10  --   --  1.11  --   --  1.00   < > = values in this interval not displayed.    Estimated Creatinine Clearance: 94 mL/min (by C-G formula based on SCr of 1 mg/dL).  Assessment:  Anticoag: acute thrombosis of the superior mesenteric vein and its branches. Hgb 12.9>10>9.1.  Plts stable. Heparin level remains therapeutic at 0.38 on 2100 units/hr.   Goal of Therapy:  Heparin level 0.3-0.7 units/ml Monitor platelets by anticoagulation protocol: Yes   Plan:  Heparin infusion at 2100 units/hr Daily HL and CBC   Vinnie LevelBenjamin Aiko Belko, PharmD., BCPS Clinical Pharmacist Clinical phone for 04/05/18 until 3:30pm: [phone] If after 3:30pm, please refer to [facility] for unit-specific pharmacist

## 2018-04-05 NOTE — Final Consult Note (Signed)
Consultant Final Sign-Off Note    Assessment/Final recommendations  Cameron Peters is a 52 y.o. male followed by [facility] for SMV thrombus. The patient has no emergent need for surgical intervention currently. His pain is slightly improved, though still present. Continue management of thrombus with IV heparin per vascular surgery. Ok to try clear liquids and advance diet clinically. Re-consult [facility] for worsening pain with concern for bowel ischemia.   Wound care (if applicable):    Diet at discharge: SOFT - low residue diet for two+ weeks   Activity at discharge: per primary team   Follow-up appointment:     Pending results:  Unresulted Labs (From admission, onward)    Start     Ordered   04/05/18 0500  Heparin level (unfractionated)  Daily,   R     04/04/18 0102   04/04/18 0500  CBC  Daily,   R     04/03/18 0932   04/03/18 1138  CEA  Once,   R     04/03/18 1137   04/03/18 1138  Cancer antigen 19-9  Once,   R     04/03/18 1137   04/03/18 0914  Protein C activity  (Hypercoagulable Panel, Comprehensive (PNL))  Once,   R     04/03/18 0914   04/03/18 0914  Protein C, total  (Hypercoagulable Panel, Comprehensive (PNL))  Once,   R     04/03/18 0914   04/03/18 0914  Protein S activity  (Hypercoagulable Panel, Comprehensive (PNL))  Once,   R     04/03/18 0914   04/03/18 0914  Protein S, total  (Hypercoagulable Panel, Comprehensive (PNL))  Once,   R     04/03/18 0914   04/03/18 0914  Lupus anticoagulant panel  (Hypercoagulable Panel, Comprehensive (PNL))  Once,   R     04/03/18 0914   04/03/18 0914  Beta-2-glycoprotein i abs, IgG/M/A  (Hypercoagulable Panel, Comprehensive (PNL))  Once,   R     04/03/18 0914   04/03/18 0914  Homocysteine, serum  (Hypercoagulable Panel, Comprehensive (PNL))  Once,   R     04/03/18 0914   04/03/18 0914  Factor 5 leiden  (Hypercoagulable Panel, Comprehensive (PNL))  Once,   R     04/03/18 0914   04/03/18 0914  Prothrombin gene mutation  (Hypercoagulable Panel,  Comprehensive (PNL))  Once,   R     04/03/18 0914   04/03/18 0914  Cardiolipin antibodies, IgG, IgM, IgA  (Hypercoagulable Panel, Comprehensive (PNL))  Once,   R     04/03/18 0914           Medication recommendations:   Other recommendations:    Thank you for allowing [place] to participate in the care of your patient!  Please consult [place] again if you have further needs for your patient.  Adam Phenix 04/05/2018 7:58 AM    Subjective  States pain is slightly improved compared to yesterday - pain has become more intermittent and sharp compared to constant and generalized 3 days ago. relieved by IV pain meds. Reports small amts flatus. Last BM was Saturday and was loose.   Objective  Vital signs in last 24 hours: Temp:  [98.2 F (36.8 C)-98.7 F (37.1 C)] 98.4 F (36.9 C) (11/18 0541) Pulse Rate:  [76-85] 85 (11/18 0541) Resp:  [15-18] 18 (11/18 0541) BP: (123-125)/(61-74) 123/74 (11/18 0541) SpO2:  [95 %-100 %] 95 % (11/18 0541)  General: sitting in the hallway, no acute distress, mobilized to his room  without issue appears stated age  CV: RRR, no m/r/g Pulm: normal effort, CTAB Abd: soft, non-distended, TTP periumbilical and epigastric area without peritonitis.  Skin: warm and dry, no rashes Psych: A&Ox3, appropriate affect   Pertinent labs and Studies: Recent Labs    04/03/18 0723 04/03/18 0732 04/04/18 0110 04/05/18 0120  WBC 12.8*  --  13.2* 11.7*  HGB 11.4* 12.9* 10.0* 9.1*  HCT 38.1* 38.0* 33.3* 30.5*   BMET Recent Labs    04/04/18 0110 04/05/18 0120  NA 133* 135  K 3.9 4.1  CL 103 106  CO2 24 21*  GLUCOSE 104* 81  BUN 12 14  CREATININE 1.11 1.00  CALCIUM 8.3* 8.3*   No results for input(s): LABURIN in the last 72 hours. No results found for this or any previous visit.  Imaging: No results found.

## 2018-04-05 NOTE — Care Management (Signed)
#   6.   S/W  JULIE  @  [employer] # [phone]   1. ELIQUIS  5 MG  BID COVER- YES CO-PAY- $ 65.71 TIER- NO PRIOR APPROVAL- YES # [phone]   2. XARELTO  20 MG DAILY COVER- YES CO-PAY-  $ 65.71 TIER- NO PRIOR APPROVAL- YES # [phone]  PREFERRED  PHARMACY : YES [facility]  90 DAY SUPPLY FOR M/O  $ 180.00

## 2018-04-05 NOTE — Progress Notes (Signed)
[facility] PROGRESS NOTE  Cameron Peters NFA:[id number] DOB: 1965/07/04 DOA: 04/03/2018  PCP: Cameron Sax, MD  Brief History/Interval Summary: 52 y/o male with a past medical history of colitis previously presented with abdominal pain.  He underwent CT scan of the abdomen which SMV thrombosis.  Patient was hospitalized for further management.    Reason for Visit: SMV thrombosis  Consultants: Phone discussion with vascular surgery by ED providers.  General surgery.  Procedures: None  Antibiotics: Levaquin changed over to Invanz on 11/17  Subjective/Interval History: Patient states that he is feeling better.  Pain in the abdomen is better controlled.  Not as severe as before.  Denies any nausea vomiting.    ROS: Denies any chest pain or shortness of breath  Objective:  Vital Signs  Vitals:   04/04/18 0509 04/04/18 1350 04/04/18 2051 04/05/18 0541  BP: 112/79 124/73 125/61 123/74  Pulse: 88 81 76 85  Resp: 16 15 18 18   Temp: 99.9 F (37.7 C) 98.2 F (36.8 C) 98.7 F (37.1 C) 98.4 F (36.9 C)  TempSrc: Oral Oral Oral Oral  SpO2: 97% 100% 98% 95%  Weight:      Height:        Intake/Output Summary (Last 24 hours) at 04/05/2018 1222 Last data filed at 04/05/2018 0824 Gross per 24 hour  Intake 1375 ml  Output -  Net 1375 ml   Filed Weights   04/03/18 0715 04/03/18 1156  Weight: 88.5 kg 88.5 kg    General appearance: Awake alert.  In no distress Resp: Normal effort at rest.  Clear to auscultation bilaterally Cardio: S1-S2 is normal regular.  No S3-S4.  No rubs murmurs or bruit GI: Abdomen is soft.  Mildly tender in the mid abdomen without any rebound rigidity or guarding.  No masses organomegaly.  Bowel sounds are present  Extremities: No edema the lower extremities Neurologic: Alert and oriented x3.  No focal neurological deficits.  Lab Results:  Data Reviewed: I have personally reviewed following labs and imaging studies  CBC: Recent  Labs  Lab 04/03/18 0723 04/03/18 0732 04/04/18 0110 04/05/18 0120  WBC 12.8*  --  13.2* 11.7*  NEUTROABS 9.1*  --   --   --   HGB 11.4* 12.9* 10.0* 9.1*  HCT 38.1* 38.0* 33.3* 30.5*  MCV 70.6*  --  70.4* 71.1*  PLT 368  --  349 354    Basic Metabolic Panel: Recent Labs  Lab 04/03/18 0723 04/03/18 0732 04/04/18 0110 04/05/18 0120  NA 134* 135 133* 135  K 4.1 3.9 3.9 4.1  CL 104 106 103 106  CO2 20*  --  24 21*  GLUCOSE 94 100* 104* 81  BUN 14 17 12 14   CREATININE 1.04 1.10 1.11 1.00  CALCIUM 8.7*  --  8.3* 8.3*    GFR: Estimated Creatinine Clearance: 94 mL/min (by C-G formula based on SCr of 1 mg/dL).  Liver Function Tests: Recent Labs  Lab 04/03/18 0723  AST 40  ALT 74*  ALKPHOS 148*  BILITOT 0.8  PROT 7.9  ALBUMIN 3.5    Recent Labs  Lab 04/03/18 0723  LIPASE 34   Coagulation Profile: Recent Labs  Lab 04/03/18 0929  INR 1.10    Lipid Profile: Recent Labs    04/03/18 1142  CHOL 122  HDL 15*  LDLCALC 84  TRIG 469  CHOLHDL 8.1      Radiology Studies: No results found.   Medications:  Scheduled: . acetaminophen  1,000 mg Oral  TID  . gabapentin  300 mg Oral QHS  . nicotine  14 mg Transdermal Daily   Continuous: . 0.9 % NaCl with KCl 20 mEq / L 75 mL/hr at 04/05/18 0133  . ertapenem 1,000 mg (04/05/18 0824)  . heparin 2,100 Units/hr (04/05/18 1040)  . lactated ringers    . methocarbamol (ROBAXIN) IV     ZOX:[id number]:acetaminophen, alum & mag hydroxide-simeth, bisacodyl, guaiFENesin-dextromethorphan, hydrocortisone, hydrocortisone cream, HYDROmorphone (DILAUDID) injection, lactated ringers, magic mouthwash, menthol-cetylpyridinium, methocarbamol (ROBAXIN) IV, ondansetron **OR** ondansetron (ZOFRAN) IV, oxyCODONE, phenol, prochlorperazine    Assessment/Plan:  Acute SMV thrombosis No previous history of any DVT or PE.  No family history of same.  Reason for this presentation not entirely clear.    Patient without any known history of  cancer.  This was discussed with Dr. Arbie CookeyEarly with vascular surgery who recommended treating this as if it was a DVT and anticoagulating this patient.  Patient was started on IV heparin.  General surgery was consulted due to concern for compromise to the bowels.  Patient has been stable.  General surgery has signed off today.  He remains on ertapenem.  Lactic acid level was normal.  LDL 84.  Will order echocardiogram.  Advance to clear liquid diet.  Will eventually transition to oral anticoagulation in the next 1-2 days depending on his clinical progress.   Hypercoagulable panel was ordered.  Antithrombin activity 79 with a normal range of 75 to 120%.  Functional protein C was 56% which is low.  Functional protein S level was 88% which is normal.  PSA 0.4.  Beta-2 glycoprotein antibody, CA-19-9, cardiolipin antibodies, Factor V Leyden, homocystine, prothrombin gene mutation pending.  History of tobacco abuse Nicotine patch  Possible history of ADD Patient noted to be on Adderall at home.  This is currently on hold.  Microcytic anemia Patient's old labs were reviewed.  MCV was not low back in March.  Anemia panel was done.  This suggested iron deficiency despite elevated ferritin which could be elevated due to inflammation.  B12 level folic acid level normal.  Drop in hemoglobin likely dilutional.  No overt bleeding has been noted.  Check stool for occult blood.  Unclear if he is ever had a colonoscopy.  Will inquire from the patient.   DVT Prophylaxis: Continue IV heparin Code Status: Full code Family Communication: Discussed with the patient.  No family at bedside Disposition Plan: Patient lives with his wife.  He is noted to be ambulating in the room.  Continue management as outlined above.    LOS: 2 days   Cameron Peters  [facility] Pager [phone] 04/05/2018, 12:22 PM  If 7PM-7AM, please contact night-coverage at [email or web address], password [email or web address]

## 2018-04-05 NOTE — Care Management Note (Signed)
Case Management Note  Patient Details  Name: Alwyn PeaMichael Goda MRN: [id number] Date of Birth: 04/05/1966  Subjective/Objective:                    Action/Plan:  Requested benefits check for :   Eliquis 5 mg BID for 30 days  Xarelto 20 mg daily for 30 days   Can provide 30 day free card and co pay card for either.   Will continue to follow  Expected Discharge Date:                  Expected Discharge Plan:  Home/Self Care  In-House Referral:     Discharge planning Services  CM Consult, Medication Assistance  Post Acute Care Choice:    Choice offered to:     DME Arranged:    DME Agency:     HH Arranged:    HH Agency:     Status of Service:  In process, will continue to follow  If discussed at Long Length of Stay Meetings, dates discussed:    Additional Comments:  Kingsley PlanWile, Doreene Forrey Marie, RN 04/05/2018, 3:29 PM

## 2018-04-05 NOTE — Progress Notes (Signed)
Initial Nutrition Assessment  DOCUMENTATION CODES:   Not applicable  INTERVENTION:   - Boost Breeze po TID, each supplement provides 250 kcal and 9 grams of protein  NUTRITION DIAGNOSIS:   Inadequate oral intake related to altered GI function as evidenced by other (clear liquid diet order).  GOAL:   Patient will meet greater than or equal to 90% of their needs  MONITOR:   PO intake, Supplement acceptance, Weight trends, Labs  REASON FOR ASSESSMENT:   Malnutrition Screening Tool    ASSESSMENT:   52 year old male who presented to the ED on 10/16 with abdominal pain. PMH significant for asthma and colitis. 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  Diet advanced to clear liquids this AM per surgery. No meal completion recorded at this time.  Spoke with pt at bedside. Pt reports that he is tolerating his clear liquid meal tray well and that the only items that gave him problems were the coffee and lemon ice (acid reflux symptoms, per pt). Discussed that clear liquids alone are unlikely to meet pt's nutrition needs. Pt amenable to RD ordering clear liquid oral nutrition supplement.  Pt states that his appetite is "up and down" and that he typically eats 2 meals daily. Pt shares that his appetite is influenced by how much he works as well as "life events." In the morning, pt may have orange juice or coffee with his medications. Lunch may include a sandwich with chips and tea. Dinner may include 1/2 of a portion of food from the diner.  Pt shares that his weight fluctuates due to work-related stress. Per weight history in chart, pt with a 9.5 lb weight loss over the past 8 months. This is a 4.6% weight loss which is not significant for timeframe.  Pt denies any issues chewing or swallowing but states that he wears dentures. Pt denies any N/V.  Pt shares that some foods (white bread, rye bread, steak, bananas) get "stuck." Pt is unsure where in his GI system these  foods get "stuck." RD encouraged adequate fluid intake with high-fiber foods to help with constipation.  Medications reviewed and include: IV NaCl with KCl 20 mEq/L at 75 ml/hr, IV heparin, IV Invanz  Labs reviewed: hemoglobin 9.1 (L)  NUTRITION - FOCUSED PHYSICAL EXAM:    Most Recent Value  Orbital Region  Mild depletion  Upper Arm Region  Mild depletion  Thoracic and Lumbar Region  No depletion  Buccal Region  No depletion  Temple Region  No depletion  Clavicle Bone Region  No depletion  Clavicle and Acromion Bone Region  No depletion  Scapular Bone Region  No depletion  Dorsal Hand  No depletion  Patellar Region  Unable to assess  Anterior Thigh Region  Unable to assess  Posterior Calf Region  Unable to assess  Edema (RD Assessment)  None  Hair  Reviewed  Eyes  Reviewed  Mouth  Reviewed  Skin  Reviewed  Nails  Reviewed       Diet Order:   Diet Order            Diet clear liquid Room service appropriate? Yes; Fluid consistency: Thin  Diet effective now              EDUCATION NEEDS:   Education needs have been addressed  Skin:  Skin Assessment: Reviewed RN Assessment  Last BM:  11/16  Height:   Ht Readings from Last 1 Encounters:  04/03/18 5\' 9"  (1.753 m)  Weight:   Wt Readings from Last 1 Encounters:  04/03/18 88.5 kg    Ideal Body Weight:  72.73 kg  BMI:  Body mass index is 28.8 kg/m.  Estimated Nutritional Needs:   Kcal:  2200-2400  Protein:  110-125 grams  Fluid:  2.2-2.4 L    Earma ReadingKate Jablonski Shaquan Puerta, MS, RD, LDN Inpatient Clinical Dietitian Pager: [phone] Weekend/After Hours: [phone]

## 2018-04-06 ENCOUNTER — Inpatient Hospital Stay (HOSPITAL_COMMUNITY): Payer: Managed Care, Other (non HMO)

## 2018-04-06 DIAGNOSIS — I340 Nonrheumatic mitral (valve) insufficiency: Secondary | ICD-10-CM

## 2018-04-06 LAB — ECHOCARDIOGRAM COMPLETE
Height: 69 in
Weight: 3120 oz

## 2018-04-06 LAB — COMPREHENSIVE METABOLIC PANEL
ALBUMIN: 2.5 g/dL — AB (ref 3.5–5.0)
ALK PHOS: 192 U/L — AB (ref 38–126)
ALT: 71 U/L — AB (ref 0–44)
AST: 54 U/L — ABNORMAL HIGH (ref 15–41)
Anion gap: 8 (ref 5–15)
BUN: 10 mg/dL (ref 6–20)
CO2: 22 mmol/L (ref 22–32)
CREATININE: 0.96 mg/dL (ref 0.61–1.24)
Calcium: 8.1 mg/dL — ABNORMAL LOW (ref 8.9–10.3)
Chloride: 105 mmol/L (ref 98–111)
GFR calc Af Amer: >60 mL/min (ref >60)
GFR calc non Af Amer: >60 mL/min (ref >60)
GLUCOSE: 106 mg/dL — AB (ref 70–99)
Potassium: 3.7 mmol/L (ref 3.5–5.1)
SODIUM: 135 mmol/L (ref 135–145)
Total Bilirubin: 1.1 mg/dL (ref 0.3–1.2)
Total Protein: 6.3 g/dL — ABNORMAL LOW (ref 6.5–8.1)

## 2018-04-06 LAB — CANCER ANTIGEN 19-9: CAN 19-9: 41 U/mL — AB (ref 0–35)

## 2018-04-06 LAB — CBC
HEMATOCRIT: 27.1 % — AB (ref 39.0–52.0)
HEMOGLOBIN: 8 g/dL — AB (ref 13.0–17.0)
MCH: 20.7 pg — ABNORMAL LOW (ref 26.0–34.0)
MCHC: 29.5 g/dL — ABNORMAL LOW (ref 30.0–36.0)
MCV: 70 fL — ABNORMAL LOW (ref 80.0–100.0)
NRBC: 0 % (ref 0.0–0.2)
Platelets: 403 10*3/uL — ABNORMAL HIGH (ref 150–400)
RBC: 3.87 MIL/uL — ABNORMAL LOW (ref 4.22–5.81)
RDW: 16 % — ABNORMAL HIGH (ref 11.5–15.5)
WBC: 9.1 10*3/uL (ref 4.0–10.5)

## 2018-04-06 LAB — DRVVT MIX: dRVVT Mix: 50.6 s — ABNORMAL HIGH (ref 0.0–47.0)

## 2018-04-06 LAB — HEPARIN LEVEL (UNFRACTIONATED): Heparin Unfractionated: 0.36 IU/mL (ref 0.30–0.70)

## 2018-04-06 LAB — LUPUS ANTICOAGULANT PANEL
DRVVT: 67.4 s — ABNORMAL HIGH (ref 0.0–47.0)
PTT Lupus Anticoagulant: 51.8 s (ref 0.0–51.9)

## 2018-04-06 LAB — PROTEIN C, TOTAL: Protein C, Total: 55 % — ABNORMAL LOW (ref 60–150)

## 2018-04-06 LAB — DRVVT CONFIRM: dRVVT Confirm: 1.7 ratio — ABNORMAL HIGH (ref 0.8–1.2)

## 2018-04-06 LAB — CEA: CEA: 3.9 ng/mL (ref 0.0–4.7)

## 2018-04-06 LAB — LACTIC ACID, PLASMA: Lactic Acid, Venous: 0.9 mmol/L (ref 0.5–1.9)

## 2018-04-06 MED ORDER — PEG-KCL-NACL-NASULF-NA ASC-C 100 G PO SOLR
0.5000 | Freq: Once | ORAL | Status: AC
  Administered 2018-04-07: 100 g via ORAL

## 2018-04-06 MED ORDER — PEG-KCL-NACL-NASULF-NA ASC-C 100 G PO SOLR: 1.0000 | Freq: Once | ORAL | Status: DC

## 2018-04-06 MED ORDER — PEG-KCL-NACL-NASULF-NA ASC-C 100 G PO SOLR
0.5000 | Freq: Once | ORAL | Status: AC
  Administered 2018-04-06: 100 g via ORAL
  Filled 2018-04-06: qty 1

## 2018-04-06 NOTE — H&P (View-Only) (Signed)
[facility] Consultation Note  Referring Provider: Dr. Bonnielee Haff [facility]) Primary Care Physician:  Libby Maw, MD  Reason for Consultation:  Blood in stool   HPI: Cameron Peters is a 52 y.o. 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  History colitis years ago, monitored expectantly, with intermittent hematochezia for the past few years.  Abdominal pain now resolved.  On heparin; long-term, at least for the next several months, is anticipated.  No prior colonoscopy.  Has microcytic anemia.   Past Medical History:  Diagnosis Date  . Asthma   . Colitis     Past Surgical History:  Procedure Laterality Date  . CHOLECYSTECTOMY    . SHOULDER SURGERY      Prior to Admission medications   Medication Sig Start Date End Date Taking? Authorizing Provider  albuterol (VENTOLIN HFA) 108 (90 Base) MCG/ACT inhaler INHALE 2 PUFFS INTO THE LUNGS EVERY 6 HOURS AS NEEDED FOR WHEEZING OR SHORTNESS OF BREATH Patient taking differently: Inhale 1-2 puffs into the lungs every 6 (six) hours as needed for wheezing or shortness of breath.  10/07/17  Yes Libby Maw, MD  amphetamine-dextroamphetamine (ADDERALL XR) 30 MG 24 hr capsule Take 1 capsule (30 mg total) by mouth as needed. Patient taking differently: Take 30 mg by mouth daily.  01/22/18  Yes Libby Maw, MD  b complex vitamins capsule Take 1 capsule by mouth daily.   Yes [provider]  Omega-3 1000 MG CAPS Take 1,000 mg by mouth daily.   Yes [provider]  omega-3 acid ethyl esters (LOVAZA) 1 g capsule Take 1 g by mouth daily.   Yes [provider]  Probiotic Product (PROBIOTIC DAILY PO) Take 20 mg by mouth daily. 4 strain   Yes [provider]    Current Facility-Administered Medications  Medication Dose Route Frequency Provider Last Rate Last Dose  . 0.9 % NaCl with KCl 20 mEq/ L  infusion   Intravenous Continuous Bonnielee Haff, MD 50 mL/hr at 04/06/18  1047    . acetaminophen (TYLENOL) suppository 650 mg  650 mg Rectal Q6H PRN Reedy Boston, MD      . acetaminophen (TYLENOL) tablet 1,000 mg  1,000 mg Oral TID Tarun Boston, MD   1,000 mg at 04/06/18 1607  . alum & mag hydroxide-simeth (MAALOX/MYLANTA) 200-200-20 MG/5ML suspension 30 mL  30 mL Oral Q6H PRN Clevland Boston, MD      . bisacodyl (DULCOLAX) suppository 10 mg  10 mg Rectal Q12H PRN Tesean Boston, MD      . ertapenem [facility]) 1,000 mg in sodium chloride 0.9 % 100 mL IVPB  1 g Intravenous Q24H Dantre Boston, MD 200 mL/hr at 04/06/18 0937 1,000 mg at 04/06/18 0937  . feeding supplement (BOOST / RESOURCE BREEZE) liquid 1 Container  1 Container Oral TID BM Bonnielee Haff, MD   1 Container at 04/06/18 1329  . gabapentin (NEURONTIN) capsule 300 mg  300 mg Oral Ardeen Fillers, MD   300 mg at 04/05/18 2224  . guaiFENesin-dextromethorphan (ROBITUSSIN DM) 100-10 MG/5ML syrup 10 mL  10 mL Oral Q4H PRN Kazden Boston, MD      . heparin ADULT infusion 100 units/mL (25000 units/266m sodium chloride 0.45%)  2,100 Units/hr Intravenous Continuous BLaren Everts RPH 21 mL/hr at 04/06/18 1049 2,100 Units/hr at 04/06/18 1049  . hydrocortisone (ANUSOL-HC) 2.5 % rectal cream 1 application  1 application Topical QID PRN GMichael Boston MD      . hydrocortisone cream 1 %  1 application  1 application Topical TID PRN Enes Boston, MD      . HYDROmorphone (DILAUDID) injection 0.5-2 mg  0.5-2 mg Intravenous Q2H PRN Bonnielee Haff, MD   1 mg at 04/06/18 1329  . lactated ringers bolus 1,000 mL  1,000 mL Intravenous TID PRN Taydon Boston, MD      . magic mouthwash  15 mL Oral QID PRN Blair Boston, MD      . menthol-cetylpyridinium (CEPACOL) lozenge 3 mg  1 lozenge Oral PRN Tracen Boston, MD      . methocarbamol (ROBAXIN) 1,000 mg in dextrose 5 % 50 mL IVPB  1,000 mg Intravenous Q6H PRN Bristol Boston, MD      . nicotine (NICODERM CQ - dosed in mg/24 hours) patch 14 mg  14 mg Transdermal Daily Crosley, Debby, MD    14 mg at 04/06/18 1041  . ondansetron (ZOFRAN) tablet 4 mg  4 mg Oral Q6H PRN Crosley, Debby, MD       Or  . ondansetron (ZOFRAN) injection 4 mg  4 mg Intravenous Q6H PRN Crosley, Debby, MD      . oxyCODONE (Oxy IR/ROXICODONE) immediate release tablet 5-10 mg  5-10 mg Oral Q4H PRN Bonnielee Haff, MD   10 mg at 04/06/18 1041  . peg 3350 powder (MOVIPREP) kit 200 g  1 kit Oral Once Arta Silence, MD      . phenol (CHLORASEPTIC) mouth spray 1-2 spray  1-2 spray Mouth/Throat PRN Adaiah Boston, MD      . prochlorperazine (COMPAZINE) injection 5-10 mg  5-10 mg Intravenous Q4H PRN Johncarlos Boston, MD        Allergies as of 04/03/2018 - Review Complete 04/03/2018  Allergen Reaction Noted  . Penicillins Other (See Comments) 08/29/2011    No family history on file.  Social History   Socioeconomic History  . Marital status: Married    Spouse name: Not on file  . Number of children: Not on file  . Years of education: Not on file  . Highest education level: Not on file  Occupational History  . Not on file  Social Needs  . Financial resource strain: Not on file  . Food insecurity:    Worry: Not on file    Inability: Not on file  . Transportation needs:    Medical: Not on file    Non-medical: Not on file  Tobacco Use  . Smoking status: Current Every Day Smoker    Packs/day: 1.00  . Smokeless tobacco: Never Used  Substance and Sexual Activity  . Alcohol use: No  . Drug use: No  . Sexual activity: Not on file  Lifestyle  . Physical activity:    Days per week: Not on file    Minutes per session: Not on file  . Stress: Not on file  Relationships  . Social connections:    Talks on phone: Not on file    Gets together: Not on file    Attends religious service: Not on file    Active member of club or organization: Not on file    Attends meetings of clubs or organizations: Not on file    Relationship status: Not on file  . Intimate partner violence:    Fear of current or ex partner:  Not on file    Emotionally abused: Not on file    Physically abused: Not on file    Forced sexual activity: Not on file  Other Topics Concern  . Not on file  Social  History Narrative  . Not on file    Review of Systems:  As per HPI, all others negative.  Physical Exam: Vital signs in last 24 hours: Temp:  [98.1 F (36.7 C)-98.3 F (36.8 C)] 98.3 F (36.8 C) (11/19 1517) Pulse Rate:  [67-72] 71 (11/19 1517) Resp:  [18] 18 (11/19 0528) BP: (119-129)/(58-77) 129/77 (11/19 1517) SpO2:  [96 %-100 %] 100 % (11/19 1517) Last BM Date: 04/03/18 General:   Alert,  Well-developed, well-nourished, pleasant and cooperative in NAD Head:  Normocephalic and atraumatic. Eyes:  Sclera clear, no icterus.   Conjunctiva pink. Ears:  Normal auditory acuity. Nose:  No deformity, discharge,  or lesions. Mouth:  No deformity or lesions.  Oropharynx pink & moist. Neck:  Supple; no masses or thyromegaly. Abdomen:  Soft, nontender and nondistended. No masses, hepatosplenomegaly or hernias noted. Normal bowel sounds, without guarding, and without rebound.     Msk:  Symmetrical without gross deformities. Normal posture. Pulses:  Normal pulses noted. Extremities:  Without clubbing or edema. Neurologic:  Alert and  oriented x4;  grossly normal neurologically. Skin:  Intact without significant lesions or rashes. Cervical Nodes:  No significant cervical adenopathy. Psych:  Alert and cooperative. Normal mood and affect.   Lab Results: Recent Labs    04/04/18 0110 04/05/18 0120 04/06/18 0314  WBC 13.2* 11.7* 9.1  HGB 10.0* 9.1* 8.0*  HCT 33.3* 30.5* 27.1*  PLT 349 354 403*   BMET Recent Labs    04/04/18 0110 04/05/18 0120 04/06/18 0314  NA 133* 135 135  K 3.9 4.1 3.7  CL 103 106 105  CO2 24 21* 22  GLUCOSE 104* 81 106*  BUN _0 CREATININE 1.11 1.00 0.96  CALCIUM 8.3* 8.3* 8.1*   LFT Recent Labs    04/06/18 0314  PROT 6.3*  ALBUMIN 2.5*  AST 54*  ALT 71*  ALKPHOS 192*   BILITOT 1.1   PT/INR No results for input(s): LABPROT, INR in the last 72 hours.  Studies/Results: Dg Abd Portable 2v  Result Date: 04/06/2018 CLINICAL DATA:  Increasing left abdominal pain today, history of superior mesenteric vein thrombosis and colitis EXAM: PORTABLE ABDOMEN - 2 VIEW COMPARISON:  CT abdomen pelvis of 04/03/2018 FINDINGS: Supine and left lateral decubitus films of the abdomen show no bowel obstruction. No free intraperitoneal air is seen. Surgical clips are noted in the right upper quadrant from prior cholecystectomy. No renal calculi are noted. The bones are unremarkable. IMPRESSION: No bowel obstruction.  No free air.  No opaque calculi. Electronically Signed   By: Ivar Drape M.D.   On: 04/06/2018 09:45    Impression:  1.  SMV thrombosis. 2.  Colitis years ago, unclear etiology. 3.  Intermittent hematochezia, no prior colonoscopy. 4.  Microcytic anemia.  Plan:  1.  Endoscopy. 2.  Risks (bleeding, infection, bowel perforation that could require surgery, sedation-related changes in cardiopulmonary systems), benefits (identification and possible treatment of source of symptoms, exclusion of certain causes of symptoms), and alternatives (watchful waiting, radiographic imaging studies, empiric medical treatment) of upper endoscopy (EGD) were explained to patient/family in detail and patient wishes to proceed. 3.  Colonoscopy. 4.  Risks (bleeding, infection, bowel perforation that could require surgery, sedation-related changes in cardiopulmonary systems), benefits (identification and possible treatment of source of symptoms, exclusion of certain causes of symptoms), and alternatives (watchful waiting, radiographic imaging studies, empiric medical treatment) of colonoscopy were explained to patient/family in detail and patient wishes to proceed. 5.  Hold heparin 6  hrs pre-procedure.  Findings on endoscopy/colonoscopy can help gauge bleeding risk for patient, who is  anticipated to need anticoagulation for the near foreseeable future.   LOS: 3 days   OUTLAW,WILLIAM M  04/06/2018, 5:25 PM  Cell [phone] If no answer or after 5 PM call [phone]

## 2018-04-06 NOTE — Consult Note (Signed)
[facility] Consultation Note  Referring Provider: Dr. Gokul Krishnan ([facility]) Primary Care Physician:  Kremer, Helton Oleson Alfred, MD  Reason for Consultation:  Blood in stool   HPI: Cameron Peters is a 52 y.o. 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  History colitis years ago, monitored expectantly, with intermittent hematochezia for the past few years.  Abdominal pain now resolved.  On heparin; long-term, at least for the next several months, is anticipated.  No prior colonoscopy.  Has microcytic anemia.   Past Medical History:  Diagnosis Date  . Asthma   . Colitis     Past Surgical History:  Procedure Laterality Date  . CHOLECYSTECTOMY    . SHOULDER SURGERY      Prior to Admission medications   Medication Sig Start Date End Date Taking? Authorizing Provider  albuterol (VENTOLIN HFA) 108 (90 Base) MCG/ACT inhaler INHALE 2 PUFFS INTO THE LUNGS EVERY 6 HOURS AS NEEDED FOR WHEEZING OR SHORTNESS OF BREATH Patient taking differently: Inhale 1-2 puffs into the lungs every 6 (six) hours as needed for wheezing or shortness of breath.  10/07/17  Yes Kremer, Britanny Marksberry Alfred, MD  amphetamine-dextroamphetamine (ADDERALL XR) 30 MG 24 hr capsule Take 1 capsule (30 mg total) by mouth as needed. Patient taking differently: Take 30 mg by mouth daily.  01/22/18  Yes Kremer, Sadiyah Kangas Alfred, MD  b complex vitamins capsule Take 1 capsule by mouth daily.   Yes [provider]  Omega-3 1000 MG CAPS Take 1,000 mg by mouth daily.   Yes [provider]  omega-3 acid ethyl esters (LOVAZA) 1 g capsule Take 1 g by mouth daily.   Yes [provider]  Probiotic Product (PROBIOTIC DAILY PO) Take 20 mg by mouth daily. 4 strain   Yes [provider]    Current Facility-Administered Medications  Medication Dose Route Frequency Provider Last Rate Last Dose  . 0.9 % NaCl with KCl 20 mEq/ L  infusion   Intravenous Continuous Krishnan, Gokul, MD 50 mL/hr at 04/06/18  1047    . acetaminophen (TYLENOL) suppository 650 mg  650 mg Rectal Q6H PRN Gross, Steven, MD      . acetaminophen (TYLENOL) tablet 1,000 mg  1,000 mg Oral TID Gross, Steven, MD   1,000 mg at 04/06/18 1607  . alum & mag hydroxide-simeth (MAALOX/MYLANTA) 200-200-20 MG/5ML suspension 30 mL  30 mL Oral Q6H PRN Gross, Steven, MD      . bisacodyl (DULCOLAX) suppository 10 mg  10 mg Rectal Q12H PRN Gross, Steven, MD      . ertapenem (INVANZ) 1,000 mg in sodium chloride 0.9 % 100 mL IVPB  1 g Intravenous Q24H Gross, Steven, MD 200 mL/hr at 04/06/18 0937 1,000 mg at 04/06/18 0937  . feeding supplement (BOOST / RESOURCE BREEZE) liquid 1 Container  1 Container Oral TID BM Krishnan, Gokul, MD   1 Container at 04/06/18 1329  . gabapentin (NEURONTIN) capsule 300 mg  300 mg Oral QHS Gross, Steven, MD   300 mg at 04/05/18 2224  . guaiFENesin-dextromethorphan (ROBITUSSIN DM) 100-10 MG/5ML syrup 10 mL  10 mL Oral Q4H PRN Gross, Steven, MD      . heparin ADULT infusion 100 units/mL (25000 units/250mL sodium chloride 0.45%)  2,100 Units/hr Intravenous Continuous Bryk, Veronda P, RPH 21 mL/hr at 04/06/18 1049 2,100 Units/hr at 04/06/18 1049  . hydrocortisone (ANUSOL-HC) 2.5 % rectal cream 1 application  1 application Topical QID PRN Gross, Steven, MD      . hydrocortisone cream 1 %   1 application  1 application Topical TID PRN Gross, Steven, MD      . HYDROmorphone (DILAUDID) injection 0.5-2 mg  0.5-2 mg Intravenous Q2H PRN Krishnan, Gokul, MD   1 mg at 04/06/18 1329  . lactated ringers bolus 1,000 mL  1,000 mL Intravenous TID PRN Gross, Steven, MD      . magic mouthwash  15 mL Oral QID PRN Gross, Steven, MD      . menthol-cetylpyridinium (CEPACOL) lozenge 3 mg  1 lozenge Oral PRN Gross, Steven, MD      . methocarbamol (ROBAXIN) 1,000 mg in dextrose 5 % 50 mL IVPB  1,000 mg Intravenous Q6H PRN Gross, Steven, MD      . nicotine (NICODERM CQ - dosed in mg/24 hours) patch 14 mg  14 mg Transdermal Daily Crosley, Debby, MD    14 mg at 04/06/18 1041  . ondansetron (ZOFRAN) tablet 4 mg  4 mg Oral Q6H PRN Crosley, Debby, MD       Or  . ondansetron (ZOFRAN) injection 4 mg  4 mg Intravenous Q6H PRN Crosley, Debby, MD      . oxyCODONE (Oxy IR/ROXICODONE) immediate release tablet 5-10 mg  5-10 mg Oral Q4H PRN Krishnan, Gokul, MD   10 mg at 04/06/18 1041  . peg 3350 powder (MOVIPREP) kit 200 g  1 kit Oral Once Kathrine Rieves, MD      . phenol (CHLORASEPTIC) mouth spray 1-2 spray  1-2 spray Mouth/Throat PRN Gross, Steven, MD      . prochlorperazine (COMPAZINE) injection 5-10 mg  5-10 mg Intravenous Q4H PRN Gross, Steven, MD        Allergies as of 04/03/2018 - Review Complete 04/03/2018  Allergen Reaction Noted  . Penicillins Other (See Comments) 08/29/2011    No family history on file.  Social History   Socioeconomic History  . Marital status: Married    Spouse name: Not on file  . Number of children: Not on file  . Years of education: Not on file  . Highest education level: Not on file  Occupational History  . Not on file  Social Needs  . Financial resource strain: Not on file  . Food insecurity:    Worry: Not on file    Inability: Not on file  . Transportation needs:    Medical: Not on file    Non-medical: Not on file  Tobacco Use  . Smoking status: Current Every Day Smoker    Packs/day: 1.00  . Smokeless tobacco: Never Used  Substance and Sexual Activity  . Alcohol use: No  . Drug use: No  . Sexual activity: Not on file  Lifestyle  . Physical activity:    Days per week: Not on file    Minutes per session: Not on file  . Stress: Not on file  Relationships  . Social connections:    Talks on phone: Not on file    Gets together: Not on file    Attends religious service: Not on file    Active member of club or organization: Not on file    Attends meetings of clubs or organizations: Not on file    Relationship status: Not on file  . Intimate partner violence:    Fear of current or ex partner:  Not on file    Emotionally abused: Not on file    Physically abused: Not on file    Forced sexual activity: Not on file  Other Topics Concern  . Not on file  Social   History Narrative  . Not on file    Review of Systems:  As per HPI, all others negative.  Physical Exam: Vital signs in last 24 hours: Temp:  [98.1 F (36.7 C)-98.3 F (36.8 C)] 98.3 F (36.8 C) (11/19 1517) Pulse Rate:  [67-72] 71 (11/19 1517) Resp:  [18] 18 (11/19 0528) BP: (119-129)/(58-77) 129/77 (11/19 1517) SpO2:  [96 %-100 %] 100 % (11/19 1517) Last BM Date: 04/03/18 General:   Alert,  Well-developed, well-nourished, pleasant and cooperative in NAD Head:  Normocephalic and atraumatic. Eyes:  Sclera clear, no icterus.   Conjunctiva pink. Ears:  Normal auditory acuity. Nose:  No deformity, discharge,  or lesions. Mouth:  No deformity or lesions.  Oropharynx pink & moist. Neck:  Supple; no masses or thyromegaly. Abdomen:  Soft, nontender and nondistended. No masses, hepatosplenomegaly or hernias noted. Normal bowel sounds, without guarding, and without rebound.     Msk:  Symmetrical without gross deformities. Normal posture. Pulses:  Normal pulses noted. Extremities:  Without clubbing or edema. Neurologic:  Alert and  oriented x4;  grossly normal neurologically. Skin:  Intact without significant lesions or rashes. Cervical Nodes:  No significant cervical adenopathy. Psych:  Alert and cooperative. Normal mood and affect.   Lab Results: Recent Labs    04/04/18 0110 04/05/18 0120 04/06/18 0314  WBC 13.2* 11.7* 9.1  HGB 10.0* 9.1* 8.0*  HCT 33.3* 30.5* 27.1*  PLT 349 354 403*   BMET Recent Labs    04/04/18 0110 04/05/18 0120 04/06/18 0314  NA 133* 135 135  K 3.9 4.1 3.7  CL 103 106 105  CO2 24 21* 22  GLUCOSE 104* 81 106*  BUN 12 14 10  CREATININE 1.11 1.00 0.96  CALCIUM 8.3* 8.3* 8.1*   LFT Recent Labs    04/06/18 0314  PROT 6.3*  ALBUMIN 2.5*  AST 54*  ALT 71*  ALKPHOS 192*   BILITOT 1.1   PT/INR No results for input(s): LABPROT, INR in the last 72 hours.  Studies/Results: Dg Abd Portable 2v  Result Date: 04/06/2018 CLINICAL DATA:  Increasing left abdominal pain today, history of superior mesenteric vein thrombosis and colitis EXAM: PORTABLE ABDOMEN - 2 VIEW COMPARISON:  CT abdomen pelvis of 04/03/2018 FINDINGS: Supine and left lateral decubitus films of the abdomen show no bowel obstruction. No free intraperitoneal air is seen. Surgical clips are noted in the right upper quadrant from prior cholecystectomy. No renal calculi are noted. The bones are unremarkable. IMPRESSION: No bowel obstruction.  No free air.  No opaque calculi. Electronically Signed   By: Paul  Barry M.D.   On: 04/06/2018 09:45    Impression:  1.  SMV thrombosis. 2.  Colitis years ago, unclear etiology. 3.  Intermittent hematochezia, no prior colonoscopy. 4.  Microcytic anemia.  Plan:  1.  Endoscopy. 2.  Risks (bleeding, infection, bowel perforation that could require surgery, sedation-related changes in cardiopulmonary systems), benefits (identification and possible treatment of source of symptoms, exclusion of certain causes of symptoms), and alternatives (watchful waiting, radiographic imaging studies, empiric medical treatment) of upper endoscopy (EGD) were explained to patient/family in detail and patient wishes to proceed. 3.  Colonoscopy. 4.  Risks (bleeding, infection, bowel perforation that could require surgery, sedation-related changes in cardiopulmonary systems), benefits (identification and possible treatment of source of symptoms, exclusion of certain causes of symptoms), and alternatives (watchful waiting, radiographic imaging studies, empiric medical treatment) of colonoscopy were explained to patient/family in detail and patient wishes to proceed. 5.  Hold heparin 6   hrs pre-procedure.  Findings on endoscopy/colonoscopy can help gauge bleeding risk for patient, who is  anticipated to need anticoagulation for the near foreseeable future.   LOS: 3 days   Syrina Wake M  04/06/2018, 5:25 PM  Cell [phone] If no answer or after 5 PM call [phone]  

## 2018-04-06 NOTE — Progress Notes (Signed)
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  Allergies  Allergen Reactions  . Penicillins Other (See Comments)    Childhood reaction - was told by parent a long time ago    Patient Measurements: Height: 5\' 9"  (175.3 cm) Weight: 195 lb (88.5 kg) IBW/kg (Calculated) : 70.7 Heparin Dosing Weight: 84 kg  Vital Signs: Temp: 98.3 F (36.8 C) (11/19 0528) Temp Source: Oral (11/19 0528) BP: 119/58 (11/19 0528) Pulse Rate: 67 (11/19 0528)  Labs: Recent Labs    04/04/18 0110  04/04/18 1353 04/05/18 0120 04/06/18 0314  HGB 10.0*  --   --  9.1* 8.0*  HCT 33.3*  --   --  30.5* 27.1*  PLT 349  --   --  354 403*  HEPARINUNFRC 0.18*   < > 0.45 0.38 0.36  CREATININE 1.11  --   --  1.00 0.96   < > = values in this interval not displayed.    Estimated Creatinine Clearance: 97.9 mL/min (by C-G formula based on SCr of 0.96 mg/dL).  Assessment:  89 YOM with acute thrombosis of the superior mesenteric vein and its branches. Hgb down to 8. Per MD, suspect this is partly due to iron deficiency. Plts high today. Per RN, there are no overt s/s of bleeding. Heparin level remains therapeutic at 0.36 on 2100 units/hr.   Goal of Therapy:  Heparin level 0.3-0.7 units/ml Monitor platelets by anticoagulation protocol: Yes   Plan:  Heparin infusion at 2100 units/hr Daily HL and CBC   Vinnie LevelBenjamin Irianna Gilday, PharmD., BCPS Clinical Pharmacist Clinical phone for 04/05/18 until 3:30pm: [phone] If after 3:30pm, [facility] for unit-specific pharmacist

## 2018-04-06 NOTE — Progress Notes (Signed)
  Echocardiogram 2D Echocardiogram has been performed.  Celene SkeenVijay  Jrue Yambao 04/06/2018, 2:29 PM

## 2018-04-06 NOTE — Progress Notes (Addendum)
[facility] PROGRESS NOTE  Kaydenn Mclear ZOX:[id number] DOB: [birth year] DOA: 04/03/2018  PCP: Mliss Sax, MD  Brief History/Interval Summary: 52 y/o male with a past medical history of colitis previously presented with abdominal pain.  He underwent CT scan of the abdomen which SMV thrombosis.  Patient was hospitalized for further management.    Reason for Visit: SMV thrombosis  Consultants: Phone discussion with vascular surgery by ED providers.  General surgery.  Procedures: None  Antibiotics: Levaquin changed over to Invanz on 11/17  Subjective/Interval History: Patient complaining of worsening abdominal pain this morning.  States that he woke up with the symptoms.  Worse than yesterday.  Similar to the pain he presented with.  Denies any vomiting yet.  Denies any bleeding.   ROS: Denies any chest pain or shortness of breath  Objective:  Vital Signs  Vitals:   04/05/18 0541 04/05/18 1531 04/05/18 2208 04/06/18 0528  BP: 123/74 114/63 120/77 (!) 119/58  Pulse: 85 79 72 67  Resp: 18 18 18 18   Temp: 98.4 F (36.9 C) 98.8 F (37.1 C) 98.1 F (36.7 C) 98.3 F (36.8 C)  TempSrc: Oral Oral Oral Oral  SpO2: 95% 98% 100% 96%  Weight:      Height:       No intake or output data in the 24 hours ending 04/06/18 1208 Filed Weights   04/03/18 0715 04/03/18 1156  Weight: 88.5 kg 88.5 kg    General appearance: Awake alert.  In no distress Resp: Normal effort at rest.  Clear to auscultation bilaterally Cardio: S1-S2 is normal regular.  No S3-S4.  No rubs murmurs or bruit GI: Abdomen is significantly more tender today compared to yesterday.  No rigidity.  Mild guarding.  Bowel sounds present.  No masses organomegaly. Extremities: No edema Neurologic: Alert and oriented x3.  No focal neurological deficits  Lab Results:  Data Reviewed: I have personally reviewed following labs and imaging studies  CBC: Recent Labs  Lab 04/03/18 0723 04/03/18 0732  04/04/18 0110 04/05/18 0120 04/06/18 0314  WBC 12.8*  --  13.2* 11.7* 9.1  NEUTROABS 9.1*  --   --   --   --   HGB 11.4* 12.9* 10.0* 9.1* 8.0*  HCT 38.1* 38.0* 33.3* 30.5* 27.1*  MCV 70.6*  --  70.4* 71.1* 70.0*  PLT 368  --  349 354 403*    Basic Metabolic Panel: Recent Labs  Lab 04/03/18 0723 04/03/18 0732 04/04/18 0110 04/05/18 0120 04/06/18 0314  NA 134* 135 133* 135 135  K 4.1 3.9 3.9 4.1 3.7  CL 104 106 103 106 105  CO2 20*  --  24 21* 22  GLUCOSE 94 100* 104* 81 106*  BUN 14 17 12 14 10   CREATININE 1.04 1.10 1.11 1.00 0.96  CALCIUM 8.7*  --  8.3* 8.3* 8.1*    GFR: Estimated Creatinine Clearance: 97.9 mL/min (by C-G formula based on SCr of 0.96 mg/dL).  Liver Function Tests: Recent Labs  Lab 04/03/18 0723 04/06/18 0314  AST 40 54*  ALT 74* 71*  ALKPHOS 148* 192*  BILITOT 0.8 1.1  PROT 7.9 6.3*  ALBUMIN 3.5 2.5*    Recent Labs  Lab 04/03/18 0723  LIPASE 34   Coagulation Profile: Recent Labs  Lab 04/03/18 0929  INR 1.10      Radiology Studies: Dg Abd Portable 2v  Result Date: 04/06/2018 CLINICAL DATA:  Increasing left abdominal pain today, history of superior mesenteric vein thrombosis and colitis EXAM: PORTABLE ABDOMEN -  2 VIEW COMPARISON:  CT abdomen pelvis of 04/03/2018 FINDINGS: Supine and left lateral decubitus films of the abdomen show no bowel obstruction. No free intraperitoneal air is seen. Surgical clips are noted in the right upper quadrant from prior cholecystectomy. No renal calculi are noted. The bones are unremarkable. IMPRESSION: No bowel obstruction.  No free air.  No opaque calculi. Electronically Signed   By: Dwyane DeePaul  Barry M.D.   On: 04/06/2018 09:45     Medications:  Scheduled: . acetaminophen  1,000 mg Oral TID  . feeding supplement  1 Container Oral TID BM  . gabapentin  300 mg Oral QHS  . nicotine  14 mg Transdermal Daily   Continuous: . 0.9 % NaCl with KCl 20 mEq / L 50 mL/hr at 04/06/18 1047  . ertapenem 1,000 mg  (04/06/18 0937)  . heparin 2,100 Units/hr (04/06/18 1049)  . lactated ringers    . methocarbamol (ROBAXIN) IV     ZOX:[id number]:acetaminophen, alum & mag hydroxide-simeth, bisacodyl, guaiFENesin-dextromethorphan, hydrocortisone, hydrocortisone cream, HYDROmorphone (DILAUDID) injection, lactated ringers, magic mouthwash, menthol-cetylpyridinium, methocarbamol (ROBAXIN) IV, ondansetron **OR** ondansetron (ZOFRAN) IV, oxyCODONE, phenol, prochlorperazine    Assessment/Plan:  Acute SMV thrombosis No previous history of any DVT or PE.  No family history of same.  Reason for this presentation not entirely clear.    Patient without any known history of cancer.  This was discussed with Dr. Arbie CookeyEarly with vascular surgery who recommended treating this as if it was a DVT and anticoagulating this patient.  Patient was started on IV heparin.  General surgery was consulted due to concern for compromise to the bowels.  Patient was placed on ertapenem.  His lactic acid level was normal.  General surgery signed off.  This morning patient feels worse with worsening abdominal pain.  Abdominal x-ray was done which does not show any acute findings.  Lactic acid was repeated and remains normal.  Patient to be given pain medications.  We will reevaluate patient this afternoon.  [facility]  Echocardiogram is pending.  He is currently on clear liquid diet.  He will eventually need to be transitioned to oral anticoagulation when he remains stable.  Hypercoagulable panel was ordered.  Antithrombin activity 79 with a normal range of 75 to 120%.  Functional protein C was 56% which is low.  Functional protein S level was 88% which is normal.  PSA 0.4.  Beta-2 glycoprotein antibody, CA-19-9, cardiolipin antibodies, Factor V Leyden, homocystine, prothrombin gene mutation pending.  ADDENDUM: Patient reevaluated around 1 PM.  He feels much better.  Abdomen is soft.  States that pain got relieved with oral pain  medications.  Denies any nausea or vomiting.  Lactic acid level is normal.  We will continue to monitor for now.  Microcytic anemia Patient's old labs were reviewed.  MCV was not low back in March.  Anemia panel was done.  This suggested iron deficiency despite elevated ferritin which could be elevated due to inflammation.  B12 level folic acid level normal.  Patient's hemoglobin continues to trend down.  No overt bleeding has been noted.  Could be dilutional.  Will check stool for occult blood.  It is unknown if the patient has ever had a colonoscopy.  ADDENDUM: Patient has never had a colonoscopy.  He has been noticing blood in his stool for the past few months.  He has lost about 10 pounds in the last few months.  Remains unclear if this is due to bowel ischemia or if  he has a colonic lesion.  Discussed with Dr. Dulce Sellar GI who will consult.  May need to consider colonoscopy before subjecting to oral anticoagulation.  Mildly elevated LFTs AST ALT noted to be mildly elevated.  Bilirubin is normal.  Alkaline phosphatase mildly elevated.  Post cholecystectomy.  CT scan showed hepatic cyst but no other abnormalities noted.  Continue to trend LFTs.  Check hepatitis panel.  HIV was nonreactive.  History of tobacco abuse Nicotine patch  Possible history of ADD Patient noted to be on Adderall at home.  This is currently on hold.  DVT Prophylaxis: Continue IV heparin Code Status: Full code Family Communication: Discussed with the patient.  No family at bedside Disposition Plan: Patient lives with his wife.  Management as outlined above.    LOS: 3 days   Osvaldo Shipper  [facility] Pager [phone] 04/06/2018, 12:08 PM  If 7PM-7AM, please contact night-coverage at [email or web address], password [facility]

## 2018-04-06 NOTE — Plan of Care (Signed)
  Problem: Clinical Measurements: Goal: Diagnostic test results will improve Outcome: Progressing   Problem: Activity: Goal: Risk for activity intolerance will decrease Outcome: Progressing   Problem: Elimination: Goal: Will not experience complications related to bowel motility Outcome: Progressing Goal: Will not experience complications related to urinary retention Outcome: Progressing   Problem: Pain Managment: Goal: General experience of comfort will improve Outcome: Progressing   Problem: Safety: Goal: Ability to remain free from injury will improve Outcome: Progressing

## 2018-04-07 ENCOUNTER — Inpatient Hospital Stay (HOSPITAL_COMMUNITY): Payer: Managed Care, Other (non HMO) | Admitting: Certified Registered Nurse Anesthetist

## 2018-04-07 ENCOUNTER — Encounter (HOSPITAL_COMMUNITY): Payer: Self-pay | Admitting: *Deleted

## 2018-04-07 ENCOUNTER — Encounter (HOSPITAL_COMMUNITY)
Admission: EM | Disposition: A | Discharge status: Home / Self Care | Payer: Self-pay | Source: Home / Self Care | Attending: Internal Medicine

## 2018-04-07 HISTORY — PX: COLONOSCOPY WITH PROPOFOL: SHX5780

## 2018-04-07 HISTORY — PX: ESOPHAGOGASTRODUODENOSCOPY (EGD) WITH PROPOFOL: SHX5813

## 2018-04-07 HISTORY — PX: BIOPSY: SHX5522

## 2018-04-07 LAB — CBC
HCT: 26.1 % — ABNORMAL LOW (ref 39.0–52.0)
Hemoglobin: 8 g/dL — ABNORMAL LOW (ref 13.0–17.0)
MCH: 21.4 pg — AB (ref 26.0–34.0)
MCHC: 30.7 g/dL (ref 30.0–36.0)
MCV: 69.8 fL — ABNORMAL LOW (ref 80.0–100.0)
PLATELETS: 431 10*3/uL — AB (ref 150–400)
RBC: 3.74 MIL/uL — ABNORMAL LOW (ref 4.22–5.81)
RDW: 16.2 % — AB (ref 11.5–15.5)
WBC: 8.8 10*3/uL (ref 4.0–10.5)
nRBC: 0 % (ref 0.0–0.2)

## 2018-04-07 LAB — COMPREHENSIVE METABOLIC PANEL
ALK PHOS: 205 U/L — AB (ref 38–126)
ALT: 88 U/L — ABNORMAL HIGH (ref 0–44)
ANION GAP: 7 (ref 5–15)
AST: 75 U/L — ABNORMAL HIGH (ref 15–41)
Albumin: 2.6 g/dL — ABNORMAL LOW (ref 3.5–5.0)
BILIRUBIN TOTAL: 0.5 mg/dL (ref 0.3–1.2)
BUN: 5 mg/dL — ABNORMAL LOW (ref 6–20)
CALCIUM: 8.2 mg/dL — AB (ref 8.9–10.3)
CO2: 22 mmol/L (ref 22–32)
Chloride: 108 mmol/L (ref 98–111)
Creatinine, Ser: 1.27 mg/dL — ABNORMAL HIGH (ref 0.61–1.24)
Glucose, Bld: 102 mg/dL — ABNORMAL HIGH (ref 70–99)
POTASSIUM: 4.1 mmol/L (ref 3.5–5.1)
Sodium: 137 mmol/L (ref 135–145)
Total Protein: 6.1 g/dL — ABNORMAL LOW (ref 6.5–8.1)

## 2018-04-07 LAB — HEPARIN LEVEL (UNFRACTIONATED)
HEPARIN UNFRACTIONATED: 0.26 [IU]/mL — AB (ref 0.30–0.70)
Heparin Unfractionated: 0.43 IU/mL (ref 0.30–0.70)

## 2018-04-07 LAB — PROTHROMBIN GENE MUTATION

## 2018-04-07 SURGERY — ESOPHAGOGASTRODUODENOSCOPY (EGD) WITH PROPOFOL
Anesthesia: Monitor Anesthesia Care | Laterality: Left

## 2018-04-07 MED ORDER — OXYCODONE-ACETAMINOPHEN 5-325 MG PO TABS
1.0000 | ORAL_TABLET | ORAL | Status: DC | PRN
  Filled 2018-04-07: qty 1

## 2018-04-07 MED ORDER — PROPOFOL 10 MG/ML IV BOLUS
INTRAVENOUS | Status: DC | PRN
  Administered 2018-04-07: 30 mg via INTRAVENOUS
  Administered 2018-04-07 (×2): 50 mg via INTRAVENOUS
  Administered 2018-04-07: 20 mg via INTRAVENOUS

## 2018-04-07 MED ORDER — LIDOCAINE 2% (20 MG/ML) 5 ML SYRINGE
INTRAMUSCULAR | Status: DC | PRN
  Administered 2018-04-07: 80 mg via INTRAVENOUS

## 2018-04-07 MED ORDER — LACTATED RINGERS IV SOLN
INTRAVENOUS | Status: DC | PRN
  Administered 2018-04-07: 11:00:00 via INTRAVENOUS

## 2018-04-07 MED ORDER — SODIUM CHLORIDE 0.9 % IV SOLN: INTRAVENOUS | Status: DC

## 2018-04-07 MED ORDER — FERROUS SULFATE 325 (65 FE) MG PO TABS
325.0000 mg | ORAL_TABLET | Freq: Two times a day (BID) | ORAL | Status: DC
  Administered 2018-04-07 – 2018-04-10 (×6): 325 mg via ORAL
  Filled 2018-04-07 (×6): qty 1

## 2018-04-07 MED ORDER — LACTATED RINGERS IV SOLN
INTRAVENOUS | Status: DC
  Administered 2018-04-07: 16:00:00 via INTRAVENOUS

## 2018-04-07 MED ORDER — PROPOFOL 500 MG/50ML IV EMUL
INTRAVENOUS | Status: DC | PRN
  Administered 2018-04-07: 150 ug/kg/min via INTRAVENOUS

## 2018-04-07 SURGICAL SUPPLY — 25 items

## 2018-04-07 NOTE — Progress Notes (Signed)
Patient tolerated the Moviprep well and completed at 0600, had 6 episodes of type 7 stools with the last one at clear yellow watery stool.  Patient had also ambulated in hall x4 and now sitting on recliner.  Heparin gtt was stopped at 0505.

## 2018-04-07 NOTE — Op Note (Addendum)
[facility] Patient Name: Cameron Peters Procedure Date : 04/07/2018 MRN: [id number] Attending MD: Willis Modena , MD Date of Birth: 06/06/1965 CSN: [id number] Age: 52 Admit Type: Inpatient Procedure:                Colonoscopy Indications:              Hematochezia, Iron deficiency anemia, no prior                            colonoscopy Providers:                Willis Modena, MD, Roselie Awkward, RN, Verita Schneiders, Technician Referring MD:              Medicines:                Propofol per Anesthesia Complications:            No immediate complications. Estimated Blood Loss:     Estimated blood loss: none. Procedure:                Pre-Anesthesia Assessment:                           - Prior to the procedure, a History and Physical                            was performed, and patient medications and                            allergies were reviewed. The patient's tolerance of                            previous anesthesia was also reviewed. The risks                            and benefits of the procedure and the sedation                            options and risks were discussed with the patient.                            All questions were answered, and informed consent                            was obtained. Prior Anticoagulants: The patient has                            taken heparin, last dose was 4 hours pre-procedure.                            ASA Grade Assessment: III - A patient with severe                            systemic disease.  After reviewing the risks and                            benefits, the patient was deemed in satisfactory                            condition to undergo the procedure.                           After obtaining informed consent, the colonoscope                            was passed under direct vision. Throughout the                            procedure, the patient's blood pressure, pulse, and                             oxygen saturations were monitored continuously. The                            Colonoscope was introduced through the anus and                            advanced to the the cecum, identified by                            appendiceal orifice and ileocecal valve. The                            colonoscopy was performed without difficulty. The                            patient tolerated the procedure well. The quality                            of the bowel preparation was good. Scope In: 11:32:20 AM Scope Out: 11:43:11 AM Scope Withdrawal Time: 0 hours 6 minutes 59 seconds  Total Procedure Duration: 0 hours 10 minutes 51 seconds  Findings:      Hemorrhoids were found on perianal exam.      Internal hemorrhoids were found during retroflexion. The hemorrhoids       were large and Grade III (internal hemorrhoids that prolapse but require       manual reduction).      No additional abnormalities were found on retroflexion.      A few medium-mouthed diverticula were found in the sigmoid colon,       descending colon and transverse colon.      Colon otherwise normal; no other polyps, masses, vascular ectasias, or       inflammatory changes were seen. Impression:               - Hemorrhoids found on perianal exam.                           - Internal hemorrhoids.                           -  Diverticulosis in the sigmoid colon, in the                            descending colon and in the transverse colon.                           - The entire examined colon is normal on direct and                            retroflexion views.                           - Bleeding highly likely from hemorrhoids. Moderate Sedation:      None Recommendation:           - Patient has a contact number available for                            emergencies. The signs and symptoms of potential                            delayed complications were discussed with the                             patient. Return to normal activities tomorrow.                            Written discharge instructions were provided to the                            patient.                           - Return patient to hospital ward for ongoing care.                           - Soft diet today.                           - Continue present medications.                           - Restart heparin without bolus in 2 hours.                           - If bleeding recurs, consider analpram/anusol-HC                            pr bid.                           - Ok for resuming anticoagulation, and starting                            long-term anticoagulation, as felt clinically  indicated.                           Deboraha Sprang- [facility] will follow. Procedure Code(s):        --- Professional ---                           [phone], Colonoscopy, flexible; diagnostic, including                            collection of specimen(s) by brushing or washing,                            when performed (separate procedure) Diagnosis Code(s):        --- Professional ---                           K64.2, Third degree hemorrhoids                           K92.1, Melena (includes Hematochezia)                           D50.9, Iron deficiency anemia, unspecified                           K57.30, Diverticulosis of large intestine without                            perforation or abscess without bleeding CPT copyright 2018 [employer]. All rights reserved. The codes documented in this report are preliminary and upon coder review may  be revised to meet current compliance requirements. Willis ModenaWilliam Adorian Gwynne, MD 04/07/2018 12:28:41 PM This report has been signed electronically. Number of Addenda: 0

## 2018-04-07 NOTE — Progress Notes (Signed)
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  Allergies  Allergen Reactions  . Penicillins Other (See Comments)    Childhood reaction - was told by parent a long time ago    Patient Measurements: Height: 5\' 9"  (175.3 cm) Weight: 195 lb (88.5 kg) IBW/kg (Calculated) : 70.7 Heparin Dosing Weight: 84 kg  Vital Signs: Temp: 98.3 F (36.8 C) (11/20 2111) Temp Source: Oral (11/20 2111) BP: 138/73 (11/20 2111) Pulse Rate: 73 (11/20 2111)  Labs: Recent Labs    04/05/18 0120 04/06/18 0314 04/07/18 0122 04/07/18 2043  HGB 9.1* 8.0* 8.0*  --   HCT 30.5* 27.1* 26.1*  --   PLT 354 403* 431*  --   HEPARINUNFRC 0.38 0.36 0.43 0.26*  CREATININE 1.00 0.96 1.27*  --     Estimated Creatinine Clearance: 74 mL/min (A) (by C-G formula based on SCr of 1.27 mg/dL (H)).  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 Hgb down to 8 (stable today).   S/P EGD, likely internal hemorrhoids   Initial hep lvl after restart 0.26 low  Goal of Therapy:  Heparin level 0.3-0.7 units/ml Monitor platelets by anticoagulation protocol: Yes   Plan:  Increase heparin gtt to 2250 units/hr Daily hep lvl cbc  Isaac BlissMichael Georgian Mcclory, PharmD, BCPS, BCCCP Clinical Pharmacist [phone]  Please check AMION for all [facility] numbers  04/07/2018 9:37 PM

## 2018-04-07 NOTE — Anesthesia Preprocedure Evaluation (Signed)
Anesthesia Evaluation  Patient identified by MRN, date of birth, ID band Patient awake    Reviewed: Allergy & Precautions, NPO status , Patient's Chart, lab work & pertinent test results  Airway Mallampati: II  TM Distance: >3 FB     Dental  (+) Dental Advisory Given   Pulmonary asthma , Current Smoker,    breath sounds clear to auscultation       Cardiovascular negative cardio ROS   Rhythm:Regular Rate:Normal     Neuro/Psych negative neurological ROS     GI/Hepatic Neg liver ROS, GERD  ,GI bleed   Endo/Other  negative endocrine ROS  Renal/GU Renal Insufficiency and ARFRenal disease     Musculoskeletal   Abdominal   Peds  Hematology  (+) anemia ,   Anesthesia Other Findings   Reproductive/Obstetrics                             Lab Results  Component Value Date   WBC 8.8 04/07/2018   HGB 8.0 (L) 04/07/2018   HCT 26.1 (L) 04/07/2018   MCV 69.8 (L) 04/07/2018   PLT 431 (H) 04/07/2018   Lab Results  Component Value Date   CREATININE 1.27 (H) 04/07/2018   BUN 5 (L) 04/07/2018   NA 137 04/07/2018   K 4.1 04/07/2018   CL 108 04/07/2018   CO2 22 04/07/2018  ; Anesthesia Physical Anesthesia Plan  ASA: III  Anesthesia Plan: MAC   Post-op Pain Management:    Induction: Intravenous  PONV Risk Score and Plan: 0 and Propofol infusion, Ondansetron and Treatment may vary due to age or medical condition  Airway Management Planned: Natural Airway and Nasal Cannula  Additional Equipment:   Intra-op Plan:   Post-operative Plan:   Informed Consent: I have reviewed the patients History and Physical, chart, labs and discussed the procedure including the risks, benefits and alternatives for the proposed anesthesia with the patient or authorized representative who has indicated his/her understanding and acceptance.     Plan Discussed with: CRNA  Anesthesia Plan Comments:          Anesthesia Quick Evaluation

## 2018-04-07 NOTE — Anesthesia Postprocedure Evaluation (Signed)
Anesthesia Post Note  Patient: Cameron Peters  Procedure(s) Performed: ESOPHAGOGASTRODUODENOSCOPY (EGD) WITH PROPOFOL (Left ) COLONOSCOPY WITH PROPOFOL (Left ) BIOPSY     Patient location during evaluation: PACU Anesthesia Type: MAC Level of consciousness: awake and alert Pain management: pain level controlled Vital Signs Assessment: post-procedure vital signs reviewed and stable Respiratory status: spontaneous breathing, nonlabored ventilation, respiratory function stable and patient connected to nasal cannula oxygen Cardiovascular status: stable and blood pressure returned to baseline Postop Assessment: no apparent nausea or vomiting Anesthetic complications: no    Last Vitals:  Vitals:   04/07/18 1200 04/07/18 1404  BP: 125/73 (!) 146/82  Pulse: 80 74  Resp: 15 20  Temp:  37 C  SpO2: 99% 100%    Last Pain:  Vitals:   04/07/18 1404  TempSrc: Oral  PainSc:                  Tiajuana Amass

## 2018-04-07 NOTE — Progress Notes (Signed)
D/w with Dr. Margo AyeHall today and ok to dc ertapenem.   Ulyses SouthwardMinh Lexander Tremblay, PharmD, BCIDP, AAHIVP, CPP Infectious Disease Pharmacist 04/07/2018 2:44 PM

## 2018-04-07 NOTE — Interval H&P Note (Signed)
History and Physical Interval Note:  04/07/2018 10:51 AM  Cameron Peters  has presented today for surgery, with the diagnosis of blood in stool, anemia  The various methods of treatment have been discussed with the patient and family. After consideration of risks, benefits and other options for treatment, the patient has consented to  Procedure(s): ESOPHAGOGASTRODUODENOSCOPY (EGD) WITH PROPOFOL (Left) COLONOSCOPY WITH PROPOFOL (Left) as a surgical intervention .  The patient's history has been reviewed, patient examined, no change in status, stable for surgery.  I have reviewed the patient's chart and labs.  Questions were answered to the patient's satisfaction.     Cameron Peters M  Assessment:  1.  Microcytic anemia. 2.  Blood in stool. 3.  SMV thrombosis, newly-diagnosed, will require anticoagulation.  Plan:  1.  Endoscopy. 2.  Risks (bleeding, infection, bowel perforation that could require surgery, sedation-related changes in cardiopulmonary systems), benefits (identification and possible treatment of source of symptoms, exclusion of certain causes of symptoms), and alternatives (watchful waiting, radiographic imaging studies, empiric medical treatment) of upper endoscopy (EGD) were explained to patient/family in detail and patient wishes to proceed. 3.  Colonoscopy. 4.  Risks (bleeding, infection, bowel perforation that could require surgery, sedation-related changes in cardiopulmonary systems), benefits (identification and possible treatment of source of symptoms, exclusion of certain causes of symptoms), and alternatives (watchful waiting, radiographic imaging studies, empiric medical treatment) of colonoscopy were explained to patient/family in detail and patient wishes to proceed.

## 2018-04-07 NOTE — Progress Notes (Addendum)
Loss of upper arm IV, switched heparin infusion to lower IV access, stopped maintenance fluids. Explained to patient plan was possible discharge tomorrow; since he is eating and drinking well may not need to restart IV.  Patient stated "I wanted one more dose of the IV pain meds". Explained he would not be able to go home on that and the oral meds and OTC would benefit at home.  Patient stil requesting, paged provider.  [address] ordered to continue oral pain meds, no IV fluids needed at this time.

## 2018-04-07 NOTE — Progress Notes (Signed)
PROGRESS NOTE  Cameron Peters ZOX:[id number] DOB: 10-Mar-1966 DOA: 04/03/2018 PCP: Mliss Sax, MD  HPI/Recap of past 24 hours: 52 y/o male with a past medical history of colitis previously presented with abdominal pain. He underwent CT scan of the abdomen which SMV thrombosis.  Patient was hospitalized for further management.     04/07/2018: Patient states POD #0 post endoscopy and colonoscopy.  Endoscopy revealed esophagitis and colonoscopy revealed diverticulosis and internal hemorrhoids.  This morning complains of mild used abdominal pain with no nausea.  Assessment/Plan: Principal Problem:   Septic thrombophlebitis of superior mesenteric vein Active Problems:   Attention and concentration deficit   Gastroesophageal reflux disease   Tobacco use   Reactive airway disease   Asthma   Mesenteric vein thrombosis  SMV thrombosis of unclear etiology No history of DVT or PE No history of cancer After discussion with vascular surgery recommendation was to treat as if it was a DVT Currently on heparin drip General surgery signed off Will need to be transitioned to oral anticoagulation once stable  Microcytic anemia/iron deficiency anemia Drop in hemoglobin Suspect secondary to internal hemorrhoids Hemoglobin 8.0 Trans-sat of 4 Begin ferrous sulfate 325 mg twice daily  AKI Baseline creatinine 0.9 Creatinine today 1.27 Avoid nephrotoxic agents/dehydration/hypotension Repeat BMP in the morning Monitor urine output Encourage oral fluid intake otherwise gentle IV fluid hydration  Code Status: Full code  Family Communication: None at bedside  Disposition Plan: Home possibly tomorrow if he tolerates a soft diet and is hemodynamically stable   Consultants:  GI  Procedures:  Endoscopy  Colonoscopy  Antimicrobials:  None  DVT prophylaxis: Heparin drip   Objective: Vitals:   04/07/18 1029 04/07/18 1150 04/07/18 1200 04/07/18 1404  BP: (!) 164/79 (!)  132/99 125/73 (!) 146/82  Pulse: 75 72 80 74  Resp: 15 17 15 20   Temp: 98.5 F (36.9 C) 97.8 F (36.6 C)  98.6 F (37 C)  TempSrc: Oral Oral  Oral  SpO2: 96% 100% 99% 100%  Weight:      Height:        Intake/Output Summary (Last 24 hours) at 04/07/2018 1447 Last data filed at 04/07/2018 1200 Gross per 24 hour  Intake 7123.02 ml  Output 500 ml  Net 6623.02 ml   Filed Weights   04/03/18 0715 04/03/18 1156  Weight: 88.5 kg 88.5 kg    Exam:  . General: 52 y.o. year-old male well developed well nourished in no acute distress.  Alert and oriented x3. . Cardiovascular: Regular rate and rhythm with no rubs or gallops.  No thyromegaly or JVD noted.   Marland Kitchen Respiratory: Clear to auscultation with no wheezes or rales. Good inspiratory effort. . Abdomen: Soft nontender nondistended with normal bowel sounds x4 quadrants. . Musculoskeletal: No lower extremity edema. 2/4 pulses in all 4 extremities. . Skin: No ulcerative lesions noted or rashes, . Psychiatry: Mood is appropriate for condition and setting   Data Reviewed: CBC: Recent Labs  Lab 04/03/18 0723 04/03/18 0732 04/04/18 0110 04/05/18 0120 04/06/18 0314 04/07/18 0122  WBC 12.8*  --  13.2* 11.7* 9.1 8.8  NEUTROABS 9.1*  --   --   --   --   --   HGB 11.4* 12.9* 10.0* 9.1* 8.0* 8.0*  HCT 38.1* 38.0* 33.3* 30.5* 27.1* 26.1*  MCV 70.6*  --  70.4* 71.1* 70.0* 69.8*  PLT 368  --  349 354 403* 431*   Basic Metabolic Panel: Recent Labs  Lab 04/03/18 0723 04/03/18 0732 04/04/18  0110 04/05/18 0120 04/06/18 0314 04/07/18 0122  NA 134* 135 133* 135 135 137  K 4.1 3.9 3.9 4.1 3.7 4.1  CL 104 106 103 106 105 108  CO2 20*  --  24 21* 22 22  GLUCOSE 94 100* 104* 81 106* 102*  BUN 14 17 12 14 10  5*  CREATININE 1.04 1.10 1.11 1.00 0.96 1.27*  CALCIUM 8.7*  --  8.3* 8.3* 8.1* 8.2*   GFR: Estimated Creatinine Clearance: 74 mL/min (A) (by C-G formula based on SCr of 1.27 mg/dL (H)). Liver Function Tests: Recent Labs  Lab  04/03/18 0723 04/06/18 0314 04/07/18 0122  AST 40 54* 75*  ALT 74* 71* 88*  ALKPHOS 148* 192* 205*  BILITOT 0.8 1.1 0.5  PROT 7.9 6.3* 6.1*  ALBUMIN 3.5 2.5* 2.6*   Recent Labs  Lab 04/03/18 0723  LIPASE 34   No results for input(s): AMMONIA in the last 168 hours. Coagulation Profile: Recent Labs  Lab 04/03/18 0929  INR 1.10   Cardiac Enzymes: No results for input(s): CKTOTAL, CKMB, CKMBINDEX, TROPONINI in the last 168 hours. BNP (last 3 results) No results for input(s): PROBNP in the last 8760 hours. HbA1C: No results for input(s): HGBA1C in the last 72 hours. CBG: No results for input(s): GLUCAP in the last 168 hours. Lipid Profile: No results for input(s): CHOL, HDL, LDLCALC, TRIG, CHOLHDL, LDLDIRECT in the last 72 hours. Thyroid Function Tests: No results for input(s): TSH, T4TOTAL, FREET4, T3FREE, THYROIDAB in the last 72 hours. Anemia Panel: Recent Labs    04/05/18 0120  VITAMINB12 466  FOLATE 24.5  FERRITIN 145  TIBC 329  IRON 13*  RETICCTPCT 0.3*   Urine analysis:    Component Value Date/Time   COLORURINE YELLOW 08/07/2017 0919   APPEARANCEUR CLEAR 08/07/2017 0919   LABSPEC 1.025 08/07/2017 0919   PHURINE 5.5 08/07/2017 0919   GLUCOSEU NEGATIVE 08/07/2017 0919   HGBUR NEGATIVE 08/07/2017 0919   BILIRUBINUR NEGATIVE 08/07/2017 0919   KETONESUR NEGATIVE 08/07/2017 0919   PROTEINUR NEGATIVE 01/19/2009 0112   UROBILINOGEN 0.2 08/07/2017 0919   NITRITE NEGATIVE 08/07/2017 0919   LEUKOCYTESUR NEGATIVE 08/07/2017 0919   Sepsis Labs: @LABRCNTIP (procalcitonin:4,lacticidven:4)  )No results found for this or any previous visit (from the past 240 hour(s)).    Studies: No results found.  Scheduled Meds: . acetaminophen  1,000 mg Oral TID  . feeding supplement  1 Container Oral TID BM  . nicotine  14 mg Transdermal Daily    Continuous Infusions: . 0.9 % NaCl with KCl 20 mEq / L 50 mL/hr at 04/07/18 1413  . heparin 2,100 Units/hr (04/07/18 1410)   . lactated ringers    . methocarbamol (ROBAXIN) IV       LOS: 4 days     [name], MD [facility] Pager [phone]  If 7PM-7AM, please contact night-coverage [email or web address] Password [email or web address] 04/07/2018, 2:47 PM

## 2018-04-07 NOTE — Transfer of Care (Signed)
Immediate Anesthesia Transfer of Care Note  Patient: Cameron Peters  Procedure(s) Performed: ESOPHAGOGASTRODUODENOSCOPY (EGD) WITH PROPOFOL (Left ) COLONOSCOPY WITH PROPOFOL (Left ) BIOPSY  Patient Location: Endoscopy Unit  Anesthesia Type:MAC  Level of Consciousness: awake, alert  and oriented  Airway & Oxygen Therapy: Patient Spontanous Breathing  Post-op Assessment: Report given to RN and Post -op Vital signs reviewed and stable  Post vital signs: Reviewed and stable  Last Vitals:  Vitals Value Taken Time  BP 132/99 04/07/2018 11:50 AM  Temp 36.6 C 04/07/2018 11:50 AM  Pulse 74 04/07/2018 11:51 AM  Resp 20 04/07/2018 11:52 AM  SpO2 100 % 04/07/2018 11:51 AM  Vitals shown include unvalidated device data.  Last Pain:  Vitals:   04/07/18 1150  TempSrc: Oral  PainSc: 0-No pain      Patients Stated Pain Goal: 2 (23/34/35 6861)  Complications: No apparent anesthesia complications

## 2018-04-07 NOTE — Op Note (Addendum)
[facility] Patient Name: Cameron Peters Procedure Date : 04/07/2018 MRN: [id number] Attending MD: Willis Modena , MD Date of Birth: 1966/04/06 CSN: [id number] Age: 52 Admit Type: Inpatient Procedure:                Upper GI endoscopy Indications:              Iron deficiency anemia, Hematochezia Providers:                Willis Modena, MD, Roselie Awkward, RN, Verita Schneiders, Technician Referring MD:             [facility] Medicines:                Propofol per Anesthesia Complications:            No immediate complications. Estimated Blood Loss:     Estimated blood loss was minimal. Procedure:                Pre-Anesthesia Assessment:                           - Prior to the procedure, a History and Physical                            was performed, and patient medications and                            allergies were reviewed. The patient's tolerance of                            previous anesthesia was also reviewed. The risks                            and benefits of the procedure and the sedation                            options and risks were discussed with the patient.                            All questions were answered, and informed consent                            was obtained. Prior Anticoagulants: The patient has                            taken heparin, last dose was 4 hours pre-procedure.                            ASA Grade Assessment: III - A patient with severe                            systemic disease. After reviewing the risks and  benefits, the patient was deemed in satisfactory                            condition to undergo the procedure.                           After obtaining informed consent, the endoscope was                            passed under direct vision. Throughout the                            procedure, the patient's blood pressure, pulse, and          oxygen saturations were monitored continuously. The                            GIF-H190 ([id number] Olympus Adult EGD was introduced                            through the mouth, and advanced to the second part                            of duodenum. The upper GI endoscopy was                            accomplished without difficulty. The patient                            tolerated the procedure well. Scope In: Scope Out: Findings:      LA Grade A (one or more mucosal breaks less than 5 mm, not extending       between tops of 2 mucosal folds) esophagitis was found.      The exam of the esophagus was otherwise normal.      The entire examined stomach was normal.      Diffuse moderately scalloped mucosa was found in the first portion of       the duodenum and in the second portion of the duodenum. Biopsies for       histology were taken with a cold forceps for evaluation of celiac       disease. Estimated blood loss was minimal.      The exam of the duodenum was otherwise normal. Impression:               - LA Grade A esophagitis.                           - Normal stomach.                           - Scalloped mucosa was found in the duodenum, rule                            out celiac disease. Biopsied. Moderate Sedation:      None Recommendation:           - Await pathology results.                           -  Perform a colonoscopy today. Procedure Code(s):        --- Professional ---                           [phone], Esophagogastroduodenoscopy, flexible,                            transoral; with biopsy, single or multiple Diagnosis Code(s):        --- Professional ---                           K20.9, Esophagitis, unspecified                           K31.89, Other diseases of stomach and duodenum                           D50.9, Iron deficiency anemia, unspecified                           K92.1, Melena (includes Hematochezia) CPT copyright 2018 American Medical  Association. All rights reserved. The codes documented in this report are preliminary and upon coder review may  be revised to meet current compliance requirements. Willis Modena, MD 04/07/2018 12:24:30 PM This report has been signed electronically. Number of Addenda: 0

## 2018-04-07 NOTE — Progress Notes (Signed)
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  Allergies  Allergen Reactions  . Penicillins Other (See Comments)    Childhood reaction - was told by parent a long time ago    Patient Measurements: Height: 5\' 9"  (175.3 cm) Weight: 195 lb (88.5 kg) IBW/kg (Calculated) : 70.7 Heparin Dosing Weight: 84 kg  Vital Signs: Temp: 98.9 F (37.2 C) (11/20 0451) Temp Source: Oral (11/20 0451) BP: 152/68 (11/20 0451) Pulse Rate: 65 (11/20 0451)  Labs: Recent Labs    04/05/18 0120 04/06/18 0314 04/07/18 0122  HGB 9.1* 8.0* 8.0*  HCT 30.5* 27.1* 26.1*  PLT 354 403* 431*  HEPARINUNFRC 0.38 0.36 0.43  CREATININE 1.00 0.96 1.27*    Estimated Creatinine Clearance: 74 mL/min (A) (by C-G formula based on SCr of 1.27 mg/dL (H)).  Assessment:  [age] YOM with acute thrombosis of the superior mesenteric vein and its branches. Hgb down to 8 (stable today). Per MD, suspect this is partly due to iron deficiency. Plts high today. HL this AM was therapeutic on 2100 units/hr but drip is now on hold for EGD today.   Goal of Therapy:  Heparin level 0.3-0.7 units/ml Monitor platelets by anticoagulation protocol: Yes   Plan:  F/u plans for anticoagulation after EGD  Daily HL and CBC   Vinnie LevelBenjamin Jamorion Gomillion, PharmD., BCPS Clinical Pharmacist Clinical phone for 04/07/18 until 3:30pm: [phone] If after 3:30pm, please refer to [facility] for unit-specific pharmacist

## 2018-04-08 LAB — HEPARIN LEVEL (UNFRACTIONATED)
HEPARIN UNFRACTIONATED: 0.27 [IU]/mL — AB (ref 0.30–0.70)
Heparin Unfractionated: 0.58 [IU]/mL (ref 0.30–0.70)
Heparin Unfractionated: 0.67 [IU]/mL (ref 0.30–0.70)

## 2018-04-08 LAB — BASIC METABOLIC PANEL
ANION GAP: 5 (ref 5–15)
BUN: 8 mg/dL (ref 6–20)
CO2: 27 mmol/L (ref 22–32)
CREATININE: 1.15 mg/dL (ref 0.61–1.24)
Calcium: 8.3 mg/dL — ABNORMAL LOW (ref 8.9–10.3)
Chloride: 108 mmol/L (ref 98–111)
GFR calc Af Amer: >60 mL/min (ref >60)
GFR calc non Af Amer: >60 mL/min (ref >60)
Glucose, Bld: 121 mg/dL — ABNORMAL HIGH (ref 70–99)
Potassium: 3.6 mmol/L (ref 3.5–5.1)
Sodium: 140 mmol/L (ref 135–145)

## 2018-04-08 LAB — HEPATITIS PANEL, ACUTE
HEP B S AG: NEGATIVE
Hep A IgM: NEGATIVE
Hep B C IgM: NEGATIVE

## 2018-04-08 LAB — CBC
HCT: 27 % — ABNORMAL LOW (ref 39.0–52.0)
Hemoglobin: 8.1 g/dL — ABNORMAL LOW (ref 13.0–17.0)
MCH: 21.1 pg — AB (ref 26.0–34.0)
MCHC: 30 g/dL (ref 30.0–36.0)
MCV: 70.5 fL — AB (ref 80.0–100.0)
PLATELETS: 437 10*3/uL — AB (ref 150–400)
RBC: 3.83 MIL/uL — ABNORMAL LOW (ref 4.22–5.81)
RDW: 16.8 % — ABNORMAL HIGH (ref 11.5–15.5)
WBC: 8.4 10*3/uL (ref 4.0–10.5)
nRBC: 0 % (ref 0.0–0.2)

## 2018-04-08 LAB — FACTOR 5 LEIDEN

## 2018-04-08 MED ORDER — MORPHINE SULFATE (PF) 2 MG/ML IV SOLN
2.0000 mg | INTRAVENOUS | Status: DC | PRN
  Administered 2018-04-08 – 2018-04-09 (×3): 2 mg via INTRAVENOUS
  Filled 2018-04-08 (×3): qty 1

## 2018-04-08 NOTE — Progress Notes (Signed)
ANTICOAGULATION CONSULT NOTE - Follow Up Consult  Pharmacy Consult for Heparin Indication: Acute thrombosis of the superior mesenteric vein and its branches  Allergies  Allergen Reactions  . Penicillins Other (See Comments)    Childhood reaction - was told by parent a long time ago    Patient Measurements: Height: 5\' 9"  (175.3 cm) Weight: 195 lb (88.5 kg) IBW/kg (Calculated) : 70.7 Heparin Dosing Weight: 84 kg  Vital Signs: Temp: 98.2 F (36.8 C) (11/21 1408) Temp Source: Oral (11/21 1408) BP: 145/83 (11/21 1408) Pulse Rate: 69 (11/21 1408)  Labs: Recent Labs    04/06/18 0314 04/07/18 0122 04/07/18 2043 04/08/18 0246 04/08/18 1236  HGB 8.0* 8.0*  --  8.1*  --   HCT 27.1* 26.1*  --  27.0*  --   PLT 403* 431*  --  437*  --   HEPARINUNFRC 0.36 0.43 0.26* 0.27* 0.58  CREATININE 0.96 1.27*  --  1.15  --     Estimated Creatinine Clearance: 81.7 mL/min (by C-G formula based on SCr of 1.15 mg/dL).  Assessment: [age] YOM with acute thrombosis of the superior mesenteric vein and its branches. Pt is currently on IV heparin. HL this afternoon is now therapeutic.   S/P EGD, bleeding thought to likely be from internal hemorrhoids   Goal of Therapy:  Heparin level 0.3-0.7 units/ml Monitor platelets by anticoagulation protocol: Yes   Plan:  Continue heparin gtt at 2400 units/hr Re-check confirmatory heparin level in 6 hours  Cameron Peters, PharmD., BCPS Clinical Pharmacist Clinical phone for 04/08/18 until 3:30pm: [phone] If after 3:30pm, please [facility] for unit-specific pharmacist

## 2018-04-08 NOTE — Plan of Care (Signed)
  Problem: Clinical Measurements: Goal: Ability to maintain clinical measurements within normal limits will improve Outcome: Progressing Goal: Diagnostic test results will improve Outcome: Progressing   Problem: Activity: Goal: Risk for activity intolerance will decrease Outcome: Progressing   Problem: Nutrition: Goal: Adequate nutrition will be maintained Outcome: Progressing   Problem: Coping: Goal: Level of anxiety will decrease Outcome: Progressing   Problem: Elimination: Goal: Will not experience complications related to bowel motility Outcome: Progressing   Problem: Pain Managment: Goal: General experience of comfort will improve Outcome: Progressing   Problem: Safety: Goal: Ability to remain free from injury will improve Outcome: Progressing

## 2018-04-08 NOTE — Progress Notes (Signed)
PROGRESS NOTE  Cameron Peters LKG:[id number] DOB: Sep 26, 1965 DOA: 04/03/2018 PCP: Mliss Sax, MD  HPI/Recap of past 24 hours: 52 y/o male with a past medical history of colitis previously presented with abdominal pain. He underwent CT scan of the abdomen which SMV thrombosis.  Patient was hospitalized for further management.     04/07/2018: Patient states POD #0 post endoscopy and colonoscopy.  Endoscopy revealed esophagitis and colonoscopy revealed diverticulosis and internal hemorrhoids.  This morning complains of mild used abdominal pain with no nausea.  04/08/18: Reports diffused abd pain 8/10 not improved with po pain meds. No nausea.  Assessment/Plan: Principal Problem:   Septic thrombophlebitis of superior mesenteric vein Active Problems:   Attention and concentration deficit   Gastroesophageal reflux disease   Tobacco use   Reactive airway disease   Asthma   Mesenteric vein thrombosis  SMV thrombosis of unclear etiology No history of DVT or PE No history of cancer After discussion with vascular surgery recommendation was to treat as if it was a DVT Currently on heparin drip General surgery signed off Will need to be transitioned to oral anticoagulation once stable Case manager to look for insurance coverage of xarelto  Post EGD/colonoscopy (04/07/18) Esophagitis, internal hemorrhoids, and diverticulosis GI following Defer to GI on when to start oral anticogulation  Microcytic anemia/iron deficiency anemia Drop in hemoglobin Suspect secondary to internal hemorrhoids Hemoglobin 8.0 Trans-sat of 4 C/w  ferrous sulfate 325 mg twice daily  AKI, resolving Baseline creatinine 0.9 Creatinine today 1.1 from 1.27 Avoid nephrotoxic agents/dehydration/hypotension Repeat BMP in the morning Monitor urine output Encourage oral fluid intake otherwise gentle IV fluid hydration  Code Status: Full code  Family Communication: None at bedside  Disposition Plan:  Home possibly tomorrow if he tolerates a soft diet and is hemodynamically stable   Consultants:  GI  Procedures:  Endoscopy  Colonoscopy  Antimicrobials:  None  DVT prophylaxis: Heparin drip   Objective: Vitals:   04/07/18 1404 04/07/18 2111 04/08/18 0548 04/08/18 1408  BP: (!) 146/82 138/73 124/73 (!) 145/83  Pulse: 74 73 66 69  Resp: 20 16 16 17   Temp: 98.6 F (37 C) 98.3 F (36.8 C) 98.1 F (36.7 C) 98.2 F (36.8 C)  TempSrc: Oral Oral Oral Oral  SpO2: 100% 100% 96% 97%  Weight:      Height:        Intake/Output Summary (Last 24 hours) at 04/08/2018 1620 Last data filed at 04/08/2018 1405 Gross per 24 hour  Intake 1804.11 ml  Output -  Net 1804.11 ml   Filed Weights   04/03/18 0715 04/03/18 1156  Weight: 88.5 kg 88.5 kg    Exam:  . General: 52 y.o. year-old male WD WN NAD A&O x 3 . Cardiovascular: RRR n rubs or gallops no JVD or thyromegaly . Respiratory: CTA no rales or wheezes. Poor inspiratory efforts. . Abdomen: Soft nontender nondistended with normal bowel sounds x4 quadrants. . Musculoskeletal: No lower extremity edema. 2/4 pulses in all 4 extremities. . Skin: No ulcerative lesions noted or rashes, . Psychiatry: Mood is appropriate for condition and setting   Data Reviewed: CBC: Recent Labs  Lab 04/03/18 0723  04/04/18 0110 04/05/18 0120 04/06/18 0314 04/07/18 0122 04/08/18 0246  WBC 12.8*  --  13.2* 11.7* 9.1 8.8 8.4  NEUTROABS 9.1*  --   --   --   --   --   --   HGB 11.4*   < > 10.0* 9.1* 8.0* 8.0* 8.1*  HCT  38.1*   < > 33.3* 30.5* 27.1* 26.1* 27.0*  MCV 70.6*  --  70.4* 71.1* 70.0* 69.8* 70.5*  PLT 368  --  349 354 403* 431* 437*   < > = values in this interval not displayed.   Basic Metabolic Panel: Recent Labs  Lab 04/04/18 0110 04/05/18 0120 04/06/18 0314 04/07/18 0122 04/08/18 0246  NA 133* 135 135 137 140  K 3.9 4.1 3.7 4.1 3.6  CL 103 106 105 108 108  CO2 24 21* 22 22 27   GLUCOSE 104* 81 106* 102* 121*  BUN  12 14 10  5* 8  CREATININE 1.11 1.00 0.96 1.27* 1.15  CALCIUM 8.3* 8.3* 8.1* 8.2* 8.3*   GFR: Estimated Creatinine Clearance: 81.7 mL/min (by C-G formula based on SCr of 1.15 mg/dL). Liver Function Tests: Recent Labs  Lab 04/03/18 0723 04/06/18 0314 04/07/18 0122  AST 40 54* 75*  ALT 74* 71* 88*  ALKPHOS 148* 192* 205*  BILITOT 0.8 1.1 0.5  PROT 7.9 6.3* 6.1*  ALBUMIN 3.5 2.5* 2.6*   Recent Labs  Lab 04/03/18 0723  LIPASE 34   No results for input(s): AMMONIA in the last 168 hours. Coagulation Profile: Recent Labs  Lab 04/03/18 0929  INR 1.10   Cardiac Enzymes: No results for input(s): CKTOTAL, CKMB, CKMBINDEX, TROPONINI in the last 168 hours. BNP (last 3 results) No results for input(s): PROBNP in the last 8760 hours. HbA1C: No results for input(s): HGBA1C in the last 72 hours. CBG: No results for input(s): GLUCAP in the last 168 hours. Lipid Profile: No results for input(s): CHOL, HDL, LDLCALC, TRIG, CHOLHDL, LDLDIRECT in the last 72 hours. Thyroid Function Tests: No results for input(s): TSH, T4TOTAL, FREET4, T3FREE, THYROIDAB in the last 72 hours. Anemia Panel: No results for input(s): VITAMINB12, FOLATE, FERRITIN, TIBC, IRON, RETICCTPCT in the last 72 hours. Urine analysis:    Component Value Date/Time   COLORURINE YELLOW 08/07/2017 0919   APPEARANCEUR CLEAR 08/07/2017 0919   LABSPEC 1.025 08/07/2017 0919   PHURINE 5.5 08/07/2017 0919   GLUCOSEU NEGATIVE 08/07/2017 0919   HGBUR NEGATIVE 08/07/2017 0919   BILIRUBINUR NEGATIVE 08/07/2017 0919   KETONESUR NEGATIVE 08/07/2017 0919   PROTEINUR NEGATIVE 01/19/2009 0112   UROBILINOGEN 0.2 08/07/2017 0919   NITRITE NEGATIVE 08/07/2017 0919   LEUKOCYTESUR NEGATIVE 08/07/2017 0919   Sepsis Labs: @LABRCNTIP (procalcitonin:4,lacticidven:4)  )No results found for this or any previous visit (from the past 240 hour(s)).    Studies: No results found.  Scheduled Meds: . acetaminophen  1,000 mg Oral TID  .  feeding supplement  1 Container Oral TID BM  . ferrous sulfate  325 mg Oral BID WC  . nicotine  14 mg Transdermal Daily    Continuous Infusions: . 0.9 % NaCl with KCl 20 mEq / L Stopped (04/07/18 2140)  . heparin 2,400 Units/hr (04/08/18 1354)  . lactated ringers    . lactated ringers Stopped (04/07/18 2140)  . methocarbamol (ROBAXIN) IV       LOS: 5 days     Darlin Droparole N Candace Ramus, MD [facility] Pager [phone]  If 7PM-7AM, please contact night-coverage [email or web address] Password [email or web address] 04/08/2018, 4:20 PM

## 2018-04-08 NOTE — Progress Notes (Signed)
ANTICOAGULATION CONSULT NOTE   Pharmacy Consult for Heparin Indication: mesenteric thrombosis  Allergies  Allergen Reactions  . Penicillins Other (See Comments)    Childhood reaction - was told by parent a long time ago    Patient Measurements: Height: 5\' 9"  (175.3 cm) Weight: 195 lb (88.5 kg) IBW/kg (Calculated) : 70.7 Heparin Dosing Weight: 84 kg  Vital Signs: Temp: 98.7 F (37.1 C) (11/21 2055) Temp Source: Oral (11/21 2055) BP: 139/83 (11/21 2055) Pulse Rate: 66 (11/21 2055)  Labs: Recent Labs    04/06/18 0314 04/07/18 0122  04/08/18 0246 04/08/18 1236 04/08/18 2254  HGB 8.0* 8.0*  --  8.1*  --   --   HCT 27.1* 26.1*  --  27.0*  --   --   PLT 403* 431*  --  437*  --   --   HEPARINUNFRC 0.36 0.43   < > 0.27* 0.58 0.67  CREATININE 0.96 1.27*  --  1.15  --   --    < > = values in this interval not displayed.    Estimated Creatinine Clearance: 81.7 mL/min (by C-G formula based on SCr of 1.15 mg/dL).  Assessment: 52 yo male with mesenteric vein thrombosis for heparin   Goal of Therapy:  Heparin level 0.3-0.7 units/ml Monitor platelets by anticoagulation protocol: Yes   Plan:  Continue Heparin at current rate   Geannie RisenGreg Derika Eckles, PharmD, BCPS

## 2018-04-08 NOTE — Plan of Care (Signed)
  Problem: Education: Goal: Knowledge of General Education information will improve Description: Including pain rating scale, medication(s)/side effects and non-pharmacologic comfort measures Outcome: Progressing   Problem: Activity: Goal: Risk for activity intolerance will decrease Outcome: Progressing   Problem: Nutrition: Goal: Adequate nutrition will be maintained Outcome: Progressing   

## 2018-04-08 NOTE — Progress Notes (Signed)
ANTICOAGULATION CONSULT NOTE - Follow Up Consult  Pharmacy Consult for Heparin Indication: Acute thrombosis of the superior mesenteric vein and its branches  Allergies  Allergen Reactions  . Penicillins Other (See Comments)    Childhood reaction - was told by parent a long time ago    Patient Measurements: Height: 5\' 9"  (175.3 cm) Weight: 195 lb (88.5 kg) IBW/kg (Calculated) : 70.7 Heparin Dosing Weight: 84 kg  Vital Signs: Temp: 98.3 F (36.8 C) (11/20 2111) Temp Source: Oral (11/20 2111) BP: 138/73 (11/20 2111) Pulse Rate: 73 (11/20 2111)  Labs: Recent Labs    04/06/18 0314 04/07/18 0122 04/07/18 2043 04/08/18 0246  HGB 8.0* 8.0*  --  8.1*  HCT 27.1* 26.1*  --  27.0*  PLT 403* 431*  --  437*  HEPARINUNFRC 0.36 0.43 0.26* 0.27*  CREATININE 0.96 1.27*  --  1.15    Estimated Creatinine Clearance: 81.7 mL/min (by C-G formula based on SCr of 1.15 mg/dL).  Assessment: [age] YOM with acute thrombosis of the superior mesenteric vein and its branches. Hgb on the low side but stable x 3 days  S/P EGD, likely internal hemorrhoids   Heparin level remains low this AM, no issues per RN.   Goal of Therapy:  Heparin level 0.3-0.7 units/ml Monitor platelets by anticoagulation protocol: Yes   Plan:  Increase heparin gtt to 2400 units/hr Re-check heparin level in 6-8 hours  Abran DukeJames Darrian Grzelak, PharmD, BCPS Clinical Pharmacist Phone: [phone]

## 2018-04-09 LAB — CBC
HEMATOCRIT: 29.7 % — AB (ref 39.0–52.0)
HEMOGLOBIN: 8.6 g/dL — AB (ref 13.0–17.0)
MCH: 20.5 pg — ABNORMAL LOW (ref 26.0–34.0)
MCHC: 29 g/dL — ABNORMAL LOW (ref 30.0–36.0)
MCV: 70.9 fL — ABNORMAL LOW (ref 80.0–100.0)
PLATELETS: 546 10*3/uL — AB (ref 150–400)
RBC: 4.19 MIL/uL — AB (ref 4.22–5.81)
RDW: 17.3 % — ABNORMAL HIGH (ref 11.5–15.5)
WBC: 9.6 10*3/uL (ref 4.0–10.5)
nRBC: 0 % (ref 0.0–0.2)

## 2018-04-09 LAB — BASIC METABOLIC PANEL
ANION GAP: 6 (ref 5–15)
BUN: 10 mg/dL (ref 6–20)
CALCIUM: 8.5 mg/dL — AB (ref 8.9–10.3)
CO2: 23 mmol/L (ref 22–32)
Chloride: 110 mmol/L (ref 98–111)
Creatinine, Ser: 1.02 mg/dL (ref 0.61–1.24)
GFR calc non Af Amer: >60 mL/min (ref >60)
Glucose, Bld: 108 mg/dL — ABNORMAL HIGH (ref 70–99)
POTASSIUM: 3.8 mmol/L (ref 3.5–5.1)
Sodium: 139 mmol/L (ref 135–145)

## 2018-04-09 LAB — HEPARIN LEVEL (UNFRACTIONATED): Heparin Unfractionated: 0.76 IU/mL — ABNORMAL HIGH (ref 0.30–0.70)

## 2018-04-09 MED ORDER — RIVAROXABAN 15 MG PO TABS
15.0000 mg | ORAL_TABLET | Freq: Two times a day (BID) | ORAL | Status: DC
  Administered 2018-04-09 – 2018-04-10 (×2): 15 mg via ORAL
  Filled 2018-04-09 (×2): qty 1

## 2018-04-09 NOTE — Care Management Note (Signed)
Case Management Note  Patient Details  Name: Cameron Peters MRN: [id number] Date of Birth: 12/06/1965  Subjective/Objective:                    Action/Plan: See benefit check note 04/05/18 Xarelto 30 day free card and $10/month cards given and explained to patient. Patient voiced understanding  Expected Discharge Date:                  Expected Discharge Plan:  Home/Self Care  In-House Referral:     Discharge planning Services  CM Consult, Medication Assistance  Post Acute Care Choice:  NA Choice offered to:  Patient, Spouse  DME Arranged:  N/A DME Agency:  NA  HH Arranged:  NA HH Agency:  NA  Status of Service:  Completed, signed off  If discussed at Long Length of Stay Meetings, dates discussed:    Additional Comments:  Kingsley PlanWile, Cameron Lounsbury Marie, RN 04/09/2018, 3:47 PM

## 2018-04-09 NOTE — Progress Notes (Signed)
PROGRESS NOTE  Cameron Peters EXB:[id number] DOB: 10-08-1965 DOA: 04/03/2018 PCP: Mliss Sax, MD  HPI/Recap of past 24 hours: 52 y/o male with a past medical history of colitis previously presented with abdominal pain. He underwent CT scan of the abdomen with contrast on 04/03/18 which revealed acute thrombosis of superior mesenteric vein.  Patient was hospitalized for further management.  EGD and colonoscopy done on 04/08/18. Endoscopy revealed esophagitis and colonoscopy revealed diverticulosis and internal hemorrhoids.  This morning complains of mild used abdominal pain with no nausea.  04/09/18: Seen and examined at bedside. States his abdomen feels better after having a bowel movement this morning. No nausea. Tolerating a diet well.  Assessment/Plan: Principal Problem:   Septic thrombophlebitis of superior mesenteric vein Active Problems:   Attention and concentration deficit   Gastroesophageal reflux disease   Tobacco use   Reactive airway disease   Asthma   Mesenteric vein thrombosis  Acute SMV thrombosis of unclear etiology No history of DVT or PE No history of cancer After discussion with vascular surgery recommendation was to treat as if it was a DVT Initially on heparin drip due to planned procedures Started on Xarelto on 04/09/18 General surgery signed off Case manager to look for insurance coverage of xarelto  Post EGD/colonoscopy (04/07/18) Esophagitis, internal hemorrhoids, and diverticulosis GI following F/u with GI outpatient Monitor on oral anticoagulant possible dc to home tomorrow once GI signs off.  Microcytic anemia/iron deficiency anemia Suspect secondary to internal hemorrhoids Hemoglobin 8.0 Trans-sat of 4 C/w  ferrous sulfate 325 mg twice daily  AKI, resolving Baseline creatinine 0.9 Creatinine today 1.02 from 1.1 from 1.27 Encourage oral fluid intake otherwise gentle IV fluid hydration  Code Status: Full code  Family  Communication: None at bedside  Disposition Plan: Home possibly tomorrow or when GI signs off.  Consultants:  GI  Procedures:  Endoscopy  Colonoscopy  Antimicrobials:  None  DVT prophylaxis: Xarelto   Objective: Vitals:   04/08/18 1408 04/08/18 2055 04/09/18 0529 04/09/18 1334  BP: (!) 145/83 139/83 (!) 146/92 127/75  Pulse: 69 66 68 83  Resp: 17 16 16 16   Temp: 98.2 F (36.8 C) 98.7 F (37.1 C) 98.7 F (37.1 C) 98.8 F (37.1 C)  TempSrc: Oral Oral Oral Oral  SpO2: 97% 98% 97% 98%  Weight:      Height:        Intake/Output Summary (Last 24 hours) at 04/09/2018 1345 Last data filed at 04/09/2018 1300 Gross per 24 hour  Intake 1080.93 ml  Output -  Net 1080.93 ml   Filed Weights   04/03/18 0715 04/03/18 1156  Weight: 88.5 kg 88.5 kg    Exam:  . General: 52 y.o. year-old male WD WN NAD A&O x 3 . Cardiovascular: RRR no rubs or gallops. No JVD or thyromegaly. Marland Kitchen Respiratory: CTA no rales or wheezes. Poor inspiratory efforts. . Abdomen: Soft nontender nondistended with normal bowel sounds x4 quadrants. . Musculoskeletal: No lower extremity edema. 2/4 pulses in all 4 extremities. . Skin: No ulcerative lesions noted or rashes, . Psychiatry: Mood is appropriate for condition and setting   Data Reviewed: CBC: Recent Labs  Lab 04/03/18 0723  04/05/18 0120 04/06/18 0314 04/07/18 0122 04/08/18 0246 04/09/18 0239  WBC 12.8*   < > 11.7* 9.1 8.8 8.4 9.6  NEUTROABS 9.1*  --   --   --   --   --   --   HGB 11.4*   < > 9.1* 8.0* 8.0* 8.1* 8.6*  HCT 38.1*   < > 30.5* 27.1* 26.1* 27.0* 29.7*  MCV 70.6*   < > 71.1* 70.0* 69.8* 70.5* 70.9*  PLT 368   < > 354 403* 431* 437* 546*   < > = values in this interval not displayed.   Basic Metabolic Panel: Recent Labs  Lab 04/05/18 0120 04/06/18 0314 04/07/18 0122 04/08/18 0246 04/09/18 0239  NA 135 135 137 140 139  K 4.1 3.7 4.1 3.6 3.8  CL 106 105 108 108 110  CO2 21* 22 22 27 23   GLUCOSE 81 106* 102* 121*  108*  BUN 14 10 5* 8 10  CREATININE 1.00 0.96 1.27* 1.15 1.02  CALCIUM 8.3* 8.1* 8.2* 8.3* 8.5*   GFR: Estimated Creatinine Clearance: 92.2 mL/min (by C-G formula based on SCr of 1.02 mg/dL). Liver Function Tests: Recent Labs  Lab 04/03/18 0723 04/06/18 0314 04/07/18 0122  AST 40 54* 75*  ALT 74* 71* 88*  ALKPHOS 148* 192* 205*  BILITOT 0.8 1.1 0.5  PROT 7.9 6.3* 6.1*  ALBUMIN 3.5 2.5* 2.6*   Recent Labs  Lab 04/03/18 0723  LIPASE 34   No results for input(s): AMMONIA in the last 168 hours. Coagulation Profile: Recent Labs  Lab 04/03/18 0929  INR 1.10   Cardiac Enzymes: No results for input(s): CKTOTAL, CKMB, CKMBINDEX, TROPONINI in the last 168 hours. BNP (last 3 results) No results for input(s): PROBNP in the last 8760 hours. HbA1C: No results for input(s): HGBA1C in the last 72 hours. CBG: No results for input(s): GLUCAP in the last 168 hours. Lipid Profile: No results for input(s): CHOL, HDL, LDLCALC, TRIG, CHOLHDL, LDLDIRECT in the last 72 hours. Thyroid Function Tests: No results for input(s): TSH, T4TOTAL, FREET4, T3FREE, THYROIDAB in the last 72 hours. Anemia Panel: No results for input(s): VITAMINB12, FOLATE, FERRITIN, TIBC, IRON, RETICCTPCT in the last 72 hours. Urine analysis:    Component Value Date/Time   COLORURINE YELLOW 08/07/2017 0919   APPEARANCEUR CLEAR 08/07/2017 0919   LABSPEC 1.025 08/07/2017 0919   PHURINE 5.5 08/07/2017 0919   GLUCOSEU NEGATIVE 08/07/2017 0919   HGBUR NEGATIVE 08/07/2017 0919   BILIRUBINUR NEGATIVE 08/07/2017 0919   KETONESUR NEGATIVE 08/07/2017 0919   PROTEINUR NEGATIVE 01/19/2009 0112   UROBILINOGEN 0.2 08/07/2017 0919   NITRITE NEGATIVE 08/07/2017 0919   LEUKOCYTESUR NEGATIVE 08/07/2017 0919   Sepsis Labs: @LABRCNTIP (procalcitonin:4,lacticidven:4)  )No results found for this or any previous visit (from the past 240 hour(s)).    Studies: No results found.  Scheduled Meds: . acetaminophen  1,000 mg Oral  TID  . feeding supplement  1 Container Oral TID BM  . ferrous sulfate  325 mg Oral BID WC  . nicotine  14 mg Transdermal Daily    Continuous Infusions: . 0.9 % NaCl with KCl 20 mEq / L Stopped (04/07/18 2140)  . heparin 2,300 Units/hr (04/09/18 1230)  . lactated ringers    . lactated ringers Stopped (04/07/18 2140)  . methocarbamol (ROBAXIN) IV       LOS: 6 days     Darlin Droparole N Canda Podgorski, MD [facility] Pager [phone]  If 7PM-7AM, please contact night-coverage [email or web address] Password [email or web address] 04/09/2018, 1:45 PM

## 2018-04-09 NOTE — Progress Notes (Signed)
ANTICOAGULATION CONSULT NOTE - Follow Up Consult  Pharmacy Consult for Heparin Indication: Acute thrombosis of the superior mesenteric vein and its branches  Allergies  Allergen Reactions  . Penicillins Other (See Comments)    Childhood reaction - was told by parent a long time ago    Patient Measurements: Height: 5\' 9"  (175.3 cm) Weight: 195 lb (88.5 kg) IBW/kg (Calculated) : 70.7 Heparin Dosing Weight: 84 kg  Vital Signs: Temp: 98.7 F (37.1 C) (11/22 0529) Temp Source: Oral (11/22 0529) BP: 146/92 (11/22 0529) Pulse Rate: 68 (11/22 0529)  Labs: Recent Labs    04/07/18 0122  04/08/18 0246 04/08/18 1236 04/08/18 2254 04/09/18 0239  HGB 8.0*  --  8.1*  --   --  8.6*  HCT 26.1*  --  27.0*  --   --  29.7*  PLT 431*  --  437*  --   --  546*  HEPARINUNFRC 0.43   < > 0.27* 0.58 0.67 0.76*  CREATININE 1.27*  --  1.15  --   --  1.02   < > = values in this interval not displayed.    Estimated Creatinine Clearance: 92.2 mL/min (by C-G formula based on SCr of 1.02 mg/dL).  Assessment: [age] YOM with acute thrombosis of the superior mesenteric vein and its branches. Pt is currently on IV heparin. HL this afternoon is now therapeutic.   S/P EGD, bleeding thought to likely be from internal hemorrhoids   Goal of Therapy:  Heparin level 0.3-0.7 units/ml Monitor platelets by anticoagulation protocol: Yes   Plan:  Decrease heparin gtt at 2300 units/hr Re-check confirmatory heparin level this afternoon  Isaac BlissMichael Mose Colaizzi, PharmD, BCPS, BCCCP Clinical Pharmacist [phone]  Please check AMION for all [facility] numbers  04/09/2018 7:13 AM

## 2018-04-09 NOTE — Evaluation (Signed)
Physical Therapy Evaluation Patient Details Name: Cameron PeaMichael Peters MRN: [id number] DOB: 01/04/1966 Today's Date: 04/09/2018   History of Present Illness  52 y/o male with a past medical history of colitis previously presented with abdominal pain.  He underwent CT scan of the abdomen which SMV thrombosis which is currently being treated with heparin. Endoscopy revealed esophagitis and colonoscopy revealed diverticulosis and internal hemorrhoids.    Clinical Impression  Patient evaluated by Physical Therapy with no further acute PT needs identified. All education has been completed and the patient has no further questions. Pt is very close to his baseline level of function and has been ambulating daily to keep his strength up. Pt has no further Physical Therapy or equipment needs. PT is signing off. Thank you for this referral.     Follow Up Recommendations No PT follow up    Equipment Recommendations  None recommended by PT       Precautions / Restrictions Precautions Precautions: None Restrictions Weight Bearing Restrictions: No      Mobility  Bed Mobility Overal bed mobility: Modified Independent             General bed mobility comments: use of bed rails to come to EoB  Transfers Overall transfer level: Independent               General transfer comment: good power up and steadying in standing without assist  Ambulation/Gait Ambulation/Gait assistance: Modified independent (Device/Increase time) Gait Distance (Feet): 600 Feet Assistive device: None Gait Pattern/deviations: Step-through pattern;WFL(Within Functional Limits) Gait velocity: slowed Gait velocity interpretation: >2.62 ft/sec, indicative of community ambulatory General Gait Details: slow, mildly unsteady ambulatation which improved with distance        Balance Overall balance assessment: Mild deficits observed, not formally tested                                            Pertinent Vitals/Pain Pain Assessment: 0-10 Pain Score: 4  Pain Location: abdomen Pain Descriptors / Indicators: Aching;Throbbing;Squeezing Pain Intervention(s): Limited activity within patient's tolerance;Monitored during session;Repositioned    Home Living Family/patient expects to be discharged to:: Private residence Living Arrangements: Spouse/significant other;Children Available Help at Discharge: Family;Available 24 hours/day Type of Home: House Home Access: Stairs to enter Entrance Stairs-Rails: Right Entrance Stairs-Number of Steps: 4 Home Layout: Two level;Able to live on main level with bedroom/bathroom Home Equipment: None      Prior Function Level of Independence: Independent         Comments: works and drives     Higher education careers adviserHand Dominance   Dominant Hand: Right    Extremity/Trunk Assessment   Upper Extremity Assessment Upper Extremity Assessment: Overall WFL for tasks assessed    Lower Extremity Assessment Lower Extremity Assessment: Overall WFL for tasks assessed    Cervical / Trunk Assessment Cervical / Trunk Assessment: Normal  Communication   Communication: No difficulties  Cognition Arousal/Alertness: Awake/alert Behavior During Therapy: WFL for tasks assessed/performed Overall Cognitive Status: Within Functional Limits for tasks assessed                                        General Comments General comments (skin integrity, edema, etc.): On entry pt request to go to bathroom, as he was experiencing some increased cramping in his stomach. Pt was able  to have a bowel movement with increased flatus, Pt reports first BM since colonscopy and reports feeling much more comfortable afterwards.        Assessment/Plan    PT Assessment Patent does not need any further PT services         PT Goals (Current goals can be found in the Care Plan section)  Acute Rehab PT Goals Patient Stated Goal: go home today PT Goal Formulation:  With patient     AM-PAC PT "6 Clicks" Daily Activity  Outcome Measure Difficulty turning over in bed (including adjusting bedclothes, sheets and blankets)?: None Difficulty moving from lying on back to sitting on the side of the bed? : A Little Difficulty sitting down on and standing up from a chair with arms (e.g., wheelchair, bedside commode, etc,.)?: None Help needed moving to and from a bed to chair (including a wheelchair)?: None Help needed walking in hospital room?: None Help needed climbing 3-5 steps with a railing? : A Little 6 Click Score: 22    End of Session   Activity Tolerance: Patient tolerated treatment well Patient left: in chair;with call bell/phone within reach Nurse Communication: Mobility status PT Visit Diagnosis: Pain;Other abnormalities of gait and mobility (R26.89) Pain - part of body: (abdomen)    Time: 1610-9604 PT Time Calculation (min) (ACUTE ONLY): 21 min   Charges:   PT Evaluation $PT Eval Low Complexity: 1 Low          Aubriella Perezgarcia B. Beverely Risen PT, DPT Acute Rehabilitation Services Pager [phone] Office [phone]   Elon Alas Fleet 04/09/2018, 9:24 AM

## 2018-04-10 DIAGNOSIS — I808 Phlebitis and thrombophlebitis of other sites: Secondary | ICD-10-CM

## 2018-04-10 LAB — CBC
HEMATOCRIT: 30.6 % — AB (ref 39.0–52.0)
Hemoglobin: 9 g/dL — ABNORMAL LOW (ref 13.0–17.0)
MCH: 21.1 pg — ABNORMAL LOW (ref 26.0–34.0)
MCHC: 29.4 g/dL — AB (ref 30.0–36.0)
MCV: 71.8 fL — AB (ref 80.0–100.0)
PLATELETS: 482 10*3/uL — AB (ref 150–400)
RBC: 4.26 MIL/uL (ref 4.22–5.81)
RDW: 17.7 % — AB (ref 11.5–15.5)
WBC: 8.2 10*3/uL (ref 4.0–10.5)
nRBC: 0 % (ref 0.0–0.2)

## 2018-04-10 MED ORDER — PANTOPRAZOLE SODIUM 40 MG PO TBEC: 40.0000 mg | DELAYED_RELEASE_TABLET | Freq: Every day | ORAL | 0 refills | Status: DC

## 2018-04-10 MED ORDER — RIVAROXABAN (XARELTO) VTE STARTER PACK (15 & 20 MG): ORAL_TABLET | ORAL | 0 refills | Status: DC

## 2018-04-10 MED ORDER — NICOTINE 14 MG/24HR TD PT24: 14.0000 mg | MEDICATED_PATCH | Freq: Every day | TRANSDERMAL | 0 refills | Status: DC

## 2018-04-10 MED ORDER — FERROUS SULFATE 325 (65 FE) MG PO TBEC: 325.0000 mg | DELAYED_RELEASE_TABLET | Freq: Two times a day (BID) | ORAL | 0 refills | Status: AC

## 2018-04-10 NOTE — Discharge Instructions (Signed)
Information on my medicine - XARELTO (rivaroxaban)  This medication education was reviewed with me or my healthcare representative as part of my discharge preparation.   WHY WAS XARELTO PRESCRIBED FOR YOU? Xarelto was prescribed to treat blood clots that may have been found in the veins of your legs (deep vein thrombosis) or in your lungs (pulmonary embolism) and to reduce the risk of them occurring again.  What do you need to know about Xarelto? The starting dose is one 15 mg tablet taken TWICE daily with food for the FIRST 21 DAYS then on 04/30/2018  the dose is changed to one 20 mg tablet taken ONCE A DAY with your evening meal.  DO NOT stop taking Xarelto without talking to the health care provider who prescribed the medication.  Refill your prescription for 20 mg tablets before you run out.  After discharge, you should have regular check-up appointments with your healthcare provider that is prescribing your Xarelto.  In the future your dose may need to be changed if your kidney function changes by a significant amount.  What do you do if you miss a dose? If you are taking Xarelto TWICE DAILY and you miss a dose, take it as soon as you remember. You may take two 15 mg tablets (total 30 mg) at the same time then resume your regularly scheduled 15 mg twice daily the next day.  If you are taking Xarelto ONCE DAILY and you miss a dose, take it as soon as you remember on the same day then continue your regularly scheduled once daily regimen the next day. Do not take two doses of Xarelto at the same time.   Important Safety Information Xarelto is a blood thinner medicine that can cause bleeding. You should call your healthcare provider right away if you experience any of the following: ? Bleeding from an injury or your nose that does not stop. ? Unusual colored urine (red or dark brown) or unusual colored stools (red or black). ? Unusual bruising for unknown reasons. ? A serious fall  or if you hit your head (even if there is no bleeding).  Some medicines may interact with Xarelto and might increase your risk of bleeding while on Xarelto. To help avoid this, consult your healthcare provider or pharmacist prior to using any new prescription or non-prescription medications, including herbals, vitamins, non-steroidal anti-inflammatory drugs (NSAIDs) and supplements.  This website has more information on Xarelto: [email or web address].

## 2018-04-10 NOTE — Progress Notes (Signed)
Pt discharged home , wife coming to pick up the Pt to discharge  Pt has his prescriptions

## 2018-04-10 NOTE — Discharge Summary (Signed)
Cameron Peters, is a 52 y.o. male  DOB [birth year]  MRN [id number].  Admission date:  04/03/2018  Admitting Physician  Gery Pray, MD  Discharge Date:  04/10/2018   Primary MD  Mliss Sax, MD  Recommendations for primary care physician for things to follow:  -Check CBC, BMP, LFT during next visit   Admission Diagnosis  Periumbilical abdominal pain [R10.33] VTE (venous thromboembolism) [I82.90]   Discharge Diagnosis  Periumbilical abdominal pain [R10.33] VTE (venous thromboembolism) [I82.90]    Principal Problem:   Septic thrombophlebitis of superior mesenteric vein Active Problems:   Attention and concentration deficit   Gastroesophageal reflux disease   Tobacco use   Reactive airway disease   Asthma   Mesenteric vein thrombosis      Past Medical History:  Diagnosis Date  . Asthma   . Colitis   . 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 BIOPSY  04/07/2018   Procedure: BIOPSY;  Surgeon: Willis Modena, MD;  Location: [facility] ENDOSCOPY;  Service: Endoscopy;;  . CHOLECYSTECTOMY    . COLONOSCOPY WITH PROPOFOL Left 04/07/2018   Procedure: COLONOSCOPY WITH PROPOFOL;  Surgeon: Willis Modena, MD;  Location: [facility] ENDOSCOPY;  Service: Endoscopy;  Laterality: Left;  . ESOPHAGOGASTRODUODENOSCOPY (EGD) WITH PROPOFOL Left 04/07/2018   Procedure: ESOPHAGOGASTRODUODENOSCOPY (EGD) WITH PROPOFOL;  Surgeon: Willis Modena, MD;  Location: [facility] ENDOSCOPY;  Service: Endoscopy;  Laterality: Left;  . SHOULDER SURGERY         History of present illness and  Hospital Course:     Kindly see H&P for history of present illness and admission details, please review complete Labs, Consult reports and Test reports for all details in brief  HPI  from the history and physical done on the day of admission 04/03/2018  HPI: This   Is a 52 y/o male who presents with c/o abdominal pain initially noted on Tuesday AM. Initially it was milder. He felt he was febrile. He had body aches and pain. He treated himself for a viral illness since. The fever finally broke Friday. After that his stomach pain became exponentially worse. His pain is centrally located without much radiation. He denies nausea or vomiting. He has never had this discomfort before. He denies any blood in his stool. He has not eaten much in the last few days. He finally decided to come to the ER. His pain has been continuous and sharp. At its worse it is 10/10, currently 10/10.  CTA reveals SMV thrombosis.  History provided by the patient and his wife who is present at bedside.    Hospital Course   52 y/o male with a past medical history of colitis previously presented with abdominal pain. He underwent CT scan of the abdomen with contrast on 04/03/18 which revealed acute thrombosis of superior mesenteric vein. Patient was hospitalized for further management. EGD and colonoscopy done on 04/08/18. Endoscopy revealed esophagitis and colonoscopy revealed diverticulosis and internal hemorrhoids.    Was started on the  coagulation, initially heparin GTT, transitioned Xarelto on discharge.  Acute SMV thrombosis of unclear etiology No history of DVT or PE No history of cancer After discussion with vascular surgery recommendation was to treat as if it was a DVT Initially on heparin drip due to planned procedures Started on Xarelto on 04/09/18  Post EGD/colonoscopy (04/07/18) Esophagitis, internal hemorrhoids, and diverticulosis Started on PPI on discharge  Microcytic anemia/iron deficiency anemia Suspect secondary to internal hemorrhoids Hemoglobin 8.0 Trans-sat of 4 C/w  ferrous sulfate 325 mg twice daily  AKI, - resolved  Transaminitis -Most likely due to SMV thrombosis, negative hepatitis panel ,repeat as an outpatient    Discharge Condition:    Stable   Follow UP  Follow-up Information    Willis Modenautlaw, William, MD Follow up in 4 week(s).   Specialty:  Gastroenterology Contact information: [address] [place] [address] [phone]        Mliss SaxKremer, William Alfred, MD Follow up in 1 week(s).   Specialty:  Family Medicine Contact information: [address] [place] [address] [phone]             Discharge Instructions  and  Discharge Medications    Discharge Instructions    Discharge instructions   Complete by:  As directed    Follow with Primary MD Mliss SaxKremer, William Alfred, MD in 7 days   Get CBC, CMP,checked  by Primary MD next visit.    Activity: As tolerated with Full fall precautions use walker/cane & assistance as needed   Disposition Home    Diet: Regular diet  For Heart failure patients - Check your Weight same time everyday, if you gain over 2 pounds, or you develop in leg swelling, experience more shortness of breath or chest pain, call your Primary MD immediately. Follow Cardiac Low Salt Diet and 1.5 lit/day fluid restriction.   On your next visit with your primary care physician please Get Medicines reviewed and adjusted.   Please request your Prim.MD to go over all Hospital Tests and Procedure/Radiological results at the follow up, please get all Hospital records sent to your Prim MD by signing hospital release before you go home.   If you experience worsening of your admission symptoms, develop shortness of breath, life threatening emergency, suicidal or homicidal thoughts you must seek medical attention immediately by calling 911 or calling your MD immediately  if symptoms less severe.  You Must read complete instructions/literature along with all the possible adverse reactions/side effects for all the Medicines you take and that have been prescribed to you. Take any new Medicines after you have completely understood and accpet all the possible adverse  reactions/side effects.   Do not drive, operating heavy machinery, perform activities at heights, swimming or participation in water activities or provide baby sitting services if your were admitted for syncope or siezures until you have seen by Primary MD or a Neurologist and advised to do so again.  Do not drive when taking Pain medications.    Do not take more than prescribed Pain, Sleep and Anxiety Medications  Special Instructions: If you have smoked or chewed Tobacco  in the last 2 yrs please stop smoking, stop any regular Alcohol  and or any Recreational drug use.  Wear Seat belts while driving.   Please note  You were cared for by a hospitalist during your hospital stay. If you have any questions about your discharge medications or the care you received while you were in the hospital after  you are discharged, you can call the unit and asked to speak with the hospitalist on call if the hospitalist that took care of you is not available. Once you are discharged, your primary care physician will handle any further medical issues. Please note that NO REFILLS for any discharge medications will be authorized once you are discharged, as it is imperative that you return to your primary care physician (or establish a relationship with a primary care physician if you do not have one) for your aftercare needs so that they can reassess your need for medications and monitor your lab values.   Increase activity slowly   Complete by:  As directed      Allergies as of 04/10/2018      Reactions   Penicillins Other (See Comments)   Childhood reaction - was told by parent a long time ago      Medication List    TAKE these medications   albuterol 108 (90 Base) MCG/ACT inhaler Commonly known as:  PROVENTIL HFA;VENTOLIN HFA INHALE 2 PUFFS INTO THE LUNGS EVERY 6 HOURS AS NEEDED FOR WHEEZING OR SHORTNESS OF BREATH What changed:    how much to take  how to take this  when to take  this  reasons to take this  additional instructions   amphetamine-dextroamphetamine 30 MG 24 hr capsule Commonly known as:  ADDERALL XR Take 1 capsule (30 mg total) by mouth as needed. What changed:  when to take this   b complex vitamins capsule Take 1 capsule by mouth daily.   ferrous sulfate 325 (65 FE) MG EC tablet Take 1 tablet (325 mg total) by mouth 2 (two) times daily.   nicotine 14 mg/24hr patch Commonly known as:  NICODERM CQ - dosed in mg/24 hours Place 1 patch (14 mg total) onto the skin daily. Start taking on:  04/11/2018   Omega-3 1000 MG Caps Take 1,000 mg by mouth daily.   omega-3 acid ethyl esters 1 g capsule Commonly known as:  LOVAZA Take 1 g by mouth daily.   pantoprazole 40 MG tablet Commonly known as:  PROTONIX Take 1 tablet (40 mg total) by mouth daily.   PROBIOTIC DAILY PO Take 20 mg by mouth daily. 4 strain   Rivaroxaban 15 & 20 MG Tbpk Take as directed on package: Start with one 15mg  tablet by mouth twice a day with food. On Day 22, switch to one 20mg  tablet once a day with food.         Diet and Activity recommendation: See Discharge Instructions above   Consults obtained - GI   Major procedures and Radiology Reports - PLEASE review detailed and final reports for all details, in brief -     Endoscopy  Colonoscopy  Dg Chest 2 View  Result Date: 04/03/2018 CLINICAL DATA:  Shortness of breath.  Recent flu. EXAM: CHEST - 2 VIEW COMPARISON:  Chest x-ray dated August 29, 2011. FINDINGS: The heart size and mediastinal contours are within normal limits. Normal pulmonary vascularity. Mildly coarsened interstitial markings are likely smoking related. No focal consolidation, pleural effusion, or pneumothorax. No acute osseous abnormality. IMPRESSION: No active cardiopulmonary disease. Electronically Signed   By: Obie Dredge M.D.   On: 04/03/2018 08:57   Ct Abdomen Pelvis W Contrast  Result Date: 04/03/2018 CLINICAL DATA:   Generalized abdominal pain EXAM: CT ABDOMEN AND PELVIS WITH CONTRAST TECHNIQUE: Multidetector CT imaging of the abdomen and pelvis was performed using the standard protocol following bolus administration of intravenous contrast.  CONTRAST:  OMNIPAQUE  300 COMPARISON:  01/19/2009 FINDINGS: Lower chest: No acute abnormality. Hepatobiliary: 15 mm left hepatic cyst is noted. The gallbladder has been surgically removed. Pancreas: Unremarkable. No pancreatic ductal dilatation or surrounding inflammatory changes. Spleen: Normal in size without focal abnormality. Adrenals/Urinary Tract: Adrenal glands are within normal limits. Kidneys are well visualized bilaterally. Right renal cyst is noted which measures 4.8 cm in greatest dimension. This is increased from the prior exam. No renal calculi or obstructive changes are seen. Bladder is well distended. Stomach/Bowel: In the mid abdomen, there is a hyperdense loop of small bowel identified without definitive obstruction. Adjacent to this, there is evidence of thrombosis of the superior mesenteric vein and its branches which extends to just short of the splenoportal confluence. This involves primarily the ileal vein branches although a few of the more proximal jejunal vein branches are noted to be involved as well. The colon is well distended without focal abnormality. A few scattered diverticula are noted. No obstructive changes are seen. The appendix is within normal limits. Vascular/Lymphatic: Aortic atherosclerosis. No enlarged abdominal or pelvic lymph nodes. Reproductive: Prostate is unremarkable. Other: No abdominal wall hernia or abnormality. No abdominopelvic ascites. Musculoskeletal: No acute or significant osseous findings. 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 Associated mesenteric edema and  hyperdense loop of ileum is seen. No obstructive changes are noted. Hepatic and renal cysts. Diverticulosis without diverticulitis. Critical Value/emergent results were called by telephone at the time of interpretation on 04/03/2018 at 9:06 am to [facility] , who verbally acknowledged these results. Electronically Signed   By: Alcide Clever M.D.   On: 04/03/2018 09:08   Dg Abd Portable 2v  Result Date: 04/06/2018 CLINICAL DATA:  Increasing left abdominal pain today, history of superior mesenteric vein thrombosis and colitis EXAM: PORTABLE ABDOMEN - 2 VIEW COMPARISON:  CT abdomen pelvis of 04/03/2018 FINDINGS: Supine and left lateral decubitus films of the abdomen show no bowel obstruction. No free intraperitoneal air is seen. Surgical clips are noted in the right upper quadrant from prior cholecystectomy. No renal calculi are noted. The bones are unremarkable. IMPRESSION: No bowel obstruction.  No free air.  No opaque calculi. Electronically Signed   By: Dwyane Dee M.D.   On: 04/06/2018 09:45    Micro Results   No results found for this or any previous visit (from the past 240 hour(s)).     Today   Subjective:   Cameron Peters today has no headache,no chest or abdominal pain,no new weakness tingling or numbness, feels much better wants to go home today.  Tolerating his diet, no nausea or abdominal pain Objective:   Blood pressure (!) 154/87, pulse 71, temperature 98.3 F (36.8 C), temperature source Oral, resp. rate 16, height 5\' 9"  (1.753 m), weight 88.5 kg, SpO2 99 %.   Intake/Output Summary (Last 24 hours) at 04/10/2018 1248 Last data filed at 04/10/2018 0959 Gross per 24 hour  Intake 1446.84 ml  Output -  Net 1446.84 ml    Exam Awake Alert, Oriented x 3, No new F.N deficits, Normal affect Symmetrical Chest wall movement, Good air movement bilaterally, CTAB RRR,No Gallops,Rubs or new Murmurs, No Parasternal Heave +ve B.Sounds, Abd Soft, Non tender,  No rebound  -guarding or rigidity. No Cyanosis, Clubbing or edema, No new Rash or bruise  Data Review   CBC w Diff:  Lab Results  Component Value Date   WBC 8.2 04/10/2018   HGB 9.0 (L) 04/10/2018   HCT 30.6 (L) 04/10/2018   PLT 482 (H) 04/10/2018   LYMPHOPCT 15 04/03/2018   MONOPCT 13 04/03/2018   EOSPCT 1 04/03/2018   BASOPCT 0 04/03/2018    CMP:  Lab Results  Component Value Date   NA 139 04/09/2018   K 3.8 04/09/2018   CL 110 04/09/2018   CO2 23 04/09/2018   BUN 10 04/09/2018   CREATININE 1.02 04/09/2018   PROT 6.1 (L) 04/07/2018   ALBUMIN 2.6 (L) 04/07/2018   BILITOT 0.5 04/07/2018   ALKPHOS 205 (H) 04/07/2018   AST 75 (H) 04/07/2018   ALT 88 (H) 04/07/2018  .   Total Time in preparing paper work, data evaluation and todays exam - 35 minutes  Huey Bienenstock M.D on 04/10/2018 at 12:48 PM  [facility]   Office  [phone]

## 2018-04-12 ENCOUNTER — Telehealth: Payer: Self-pay | Admitting: Behavioral Health

## 2018-04-12 NOTE — Telephone Encounter (Signed)
Patient declines TCM/Hospital Follow-up visit at this time. He voiced, " that he would like to keep in place his appointment for 04/27/18 at 9:30 with Dr. Erle CrockerKremer-3 month follow-up".

## 2018-04-23 ENCOUNTER — Ambulatory Visit: Payer: Managed Care, Other (non HMO) | Admitting: Family Medicine

## 2018-04-27 ENCOUNTER — Ambulatory Visit: Payer: Managed Care, Other (non HMO) | Admitting: Family Medicine

## 2018-04-27 ENCOUNTER — Encounter: Payer: Self-pay | Admitting: Family Medicine

## 2018-04-27 VITALS — BP 128/80 | HR 101 | Ht 69.0 in | Wt 186.2 lb

## 2018-04-27 DIAGNOSIS — D649 Anemia, unspecified: Secondary | ICD-10-CM

## 2018-04-27 DIAGNOSIS — I81 Portal vein thrombosis: Secondary | ICD-10-CM

## 2018-04-27 DIAGNOSIS — R41840 Attention and concentration deficit: Secondary | ICD-10-CM | POA: Diagnosis not present

## 2018-04-27 DIAGNOSIS — K55069 Acute infarction of intestine, part and extent unspecified: Secondary | ICD-10-CM

## 2018-04-27 DIAGNOSIS — Z720 Tobacco use: Secondary | ICD-10-CM | POA: Diagnosis not present

## 2018-04-27 LAB — VITAMIN B12: VITAMIN B 12: 483 pg/mL (ref 211–911)

## 2018-04-27 LAB — CBC
HEMATOCRIT: 41.8 % (ref 39.0–52.0)
Hemoglobin: 13 g/dL (ref 13.0–17.0)
MCHC: 31 g/dL (ref 30.0–36.0)
MCV: 73 fl — AB (ref 78.0–100.0)
Platelets: 388 10*3/uL (ref 150.0–400.0)
RBC: 5.73 Mil/uL (ref 4.22–5.81)
RDW: 23.1 % — AB (ref 11.5–15.5)
WBC: 7.4 10*3/uL (ref 4.0–10.5)

## 2018-04-27 MED ORDER — AMPHETAMINE-DEXTROAMPHET ER 30 MG PO CP24: 30.0000 mg | ORAL_CAPSULE | ORAL | 0 refills | Status: DC | PRN

## 2018-04-27 NOTE — Patient Instructions (Signed)
Steps to Quit Smoking Smoking tobacco can be harmful to your health and can affect almost every organ in your body. Smoking puts you, and those around you, at risk for developing many serious chronic diseases. Quitting smoking is difficult, but it is one of the best things that you can do for your health. It is never too late to quit. What are the benefits of quitting smoking? When you quit smoking, you lower your risk of developing serious diseases and conditions, such as:  Lung cancer or lung disease, such as COPD.  Heart disease.  Stroke.  Heart attack.  Infertility.  Osteoporosis and bone fractures.  Additionally, symptoms such as coughing, wheezing, and shortness of breath may get better when you quit. You may also find that you get sick less often because your body is stronger at fighting off colds and infections. If you are pregnant, quitting smoking can help to reduce your chances of having a baby of low birth weight. How do I get ready to quit? When you decide to quit smoking, create a plan to make sure that you are successful. Before you quit:  Pick a date to quit. Set a date within the next two weeks to give you time to prepare.  Write down the reasons why you are quitting. Keep this list in places where you will see it often, such as on your bathroom mirror or in your car or wallet.  Identify the people, places, things, and activities that make you want to smoke (triggers) and avoid them. Make sure to take these actions: ? Throw away all cigarettes at home, at work, and in your car. ? Throw away smoking accessories, such as ashtrays and lighters. ? Clean your car and make sure to empty the ashtray. ? Clean your home, including curtains and carpets.  Tell your family, friends, and coworkers that you are quitting. Support from your loved ones can make quitting easier.  Talk with your health care provider about your options for quitting smoking.  Find out what treatment  options are covered by your health insurance.  What strategies can I use to quit smoking? Talk with your healthcare provider about different strategies to quit smoking. Some strategies include:  Quitting smoking altogether instead of gradually lessening how much you smoke over a period of time. Research shows that quitting "cold turkey" is more successful than gradually quitting.  Attending in-person counseling to help you build problem-solving skills. You are more likely to have success in quitting if you attend several counseling sessions. Even short sessions of 10 minutes can be effective.  Finding resources and support systems that can help you to quit smoking and remain smoke-free after you quit. These resources are most helpful when you use them often. They can include: ? Online chats with a counselor. ? Telephone quitlines. ? Printed self-help materials. ? Support groups or group counseling. ? Text messaging programs. ? Mobile phone applications.  Taking medicines to help you quit smoking. (If you are pregnant or breastfeeding, talk with your health care provider first.) Some medicines contain nicotine and some do not. Both types of medicines help with cravings, but the medicines that include nicotine help to relieve withdrawal symptoms. Your health care provider may recommend: ? Nicotine patches, gum, or lozenges. ? Nicotine inhalers or sprays. ? Non-nicotine medicine that is taken by mouth.  Talk with your health care provider about combining strategies, such as taking medicines while you are also receiving in-person counseling. Using these two strategies together   makes you more likely to succeed in quitting than if you used either strategy on its own. If you are pregnant or breastfeeding, talk with your health care provider about finding counseling or other support strategies to quit smoking. Do not take medicine to help you quit smoking unless told to do so by your health care  provider. What things can I do to make it easier to quit? Quitting smoking might feel overwhelming at first, but there is a lot that you can do to make it easier. Take these important actions:  Reach out to your family and friends and ask that they support and encourage you during this time. Call telephone quitlines, reach out to support groups, or work with a counselor for support.  Ask people who smoke to avoid smoking around you.  Avoid places that trigger you to smoke, such as bars, parties, or smoke-break areas at work.  Spend time around people who do not smoke.  Lessen stress in your life, because stress can be a smoking trigger for some people. To lessen stress, try: ? Exercising regularly. ? Deep-breathing exercises. ? Yoga. ? Meditating. ? Performing a body scan. This involves closing your eyes, scanning your body from head to toe, and noticing which parts of your body are particularly tense. Purposefully relax the muscles in those areas.  Download or purchase mobile phone or tablet apps (applications) that can help you stick to your quit plan by providing reminders, tips, and encouragement. There are many free apps, such as QuitGuide from the CDC (Centers for Disease Control and Prevention). You can find other support for quitting smoking (smoking cessation) through smokefree.gov and other websites.  How will I feel when I quit smoking? Within the first 24 hours of quitting smoking, you may start to feel some withdrawal symptoms. These symptoms are usually most noticeable 2-3 days after quitting, but they usually do not last beyond 2-3 weeks. Changes or symptoms that you might experience include:  Mood swings.  Restlessness, anxiety, or irritation.  Difficulty concentrating.  Dizziness.  Strong cravings for sugary foods in addition to nicotine.  Mild weight gain.  Constipation.  Nausea.  Coughing or a sore throat.  Changes in how your medicines work in your  body.  A depressed mood.  Difficulty sleeping (insomnia).  After the first 2-3 weeks of quitting, you may start to notice more positive results, such as:  Improved sense of smell and taste.  Decreased coughing and sore throat.  Slower heart rate.  Lower blood pressure.  Clearer skin.  The ability to breathe more easily.  Fewer sick days.  Quitting smoking is very challenging for most people. Do not get discouraged if you are not successful the first time. Some people need to make many attempts to quit before they achieve long-term success. Do your best to stick to your quit plan, and talk with your health care provider if you have any questions or concerns. This information is not intended to replace advice given to you by your health care provider. Make sure you discuss any questions you have with your health care provider. Document Released: 04/29/2001 Document Revised: 01/01/2016 Document Reviewed: 09/19/2014 Elsevier Interactive Patient Education  2018 Elsevier Inc.  Coping with Quitting Smoking Quitting smoking is a physical and mental challenge. You will face cravings, withdrawal symptoms, and temptation. Before quitting, work with your health care provider to make a plan that can help you cope. Preparation can help you quit and keep you from giving in. How   can I cope with cravings? Cravings usually last for 5-10 minutes. If you get through it, the craving will pass. Consider taking the following actions to help you cope with cravings:  Keep your mouth busy: ? Chew sugar-free gum. ? Suck on hard candies or a straw. ? Brush your teeth.  Keep your hands and body busy: ? Immediately change to a different activity when you feel a craving. ? Squeeze or play with a ball. ? Do an activity or a hobby, like making bead jewelry, practicing needlepoint, or working with wood. ? Mix up your normal routine. ? Take a short exercise break. Go for a quick walk or run up and down  stairs. ? Spend time in public places where smoking is not allowed.  Focus on doing something kind or helpful for someone else.  Call a friend or family member to talk during a craving.  Join a support group.  Call a quit line, such as 1-800-QUIT-NOW.  Talk with your health care provider about medicines that might help you cope with cravings and make quitting easier for you.  How can I deal with withdrawal symptoms? Your body may experience negative effects as it tries to get used to not having nicotine in the system. These effects are called withdrawal symptoms. They may include:  Feeling hungrier than normal.  Trouble concentrating.  Irritability.  Trouble sleeping.  Feeling depressed.  Restlessness and agitation.  Craving a cigarette.  To manage withdrawal symptoms:  Avoid places, people, and activities that trigger your cravings.  Remember why you want to quit.  Get plenty of sleep.  Avoid coffee and other caffeinated drinks. These may worsen some of your symptoms.  How can I handle social situations? Social situations can be difficult when you are quitting smoking, especially in the first few weeks. To manage this, you can:  Avoid parties, bars, and other social situations where people might be smoking.  Avoid alcohol.  Leave right away if you have the urge to smoke.  Explain to your family and friends that you are quitting smoking. Ask for understanding and support.  Plan activities with friends or family where smoking is not an option.  What are some ways I can cope with stress? Wanting to smoke may cause stress, and stress can make you want to smoke. Find ways to manage your stress. Relaxation techniques can help. For example:  Breathe slowly and deeply, in through your nose and out through your mouth.  Listen to soothing, relaxing music.  Talk with a family member or friend about your stress.  Light a candle.  Soak in a bath or take a  shower.  Think about a peaceful place.  What are some ways I can prevent weight gain? Be aware that many people gain weight after they quit smoking. However, not everyone does. To keep from gaining weight, have a plan in place before you quit and stick to the plan after you quit. Your plan should include:  Having healthy snacks. When you have a craving, it may help to: ? Eat plain popcorn, crunchy carrots, celery, or other cut vegetables. ? Chew sugar-free gum.  Changing how you eat: ? Eat small portion sizes at meals. ? Eat 4-6 small meals throughout the day instead of 1-2 large meals a day. ? Be mindful when you eat. Do not watch television or do other things that might distract you as you eat.  Exercising regularly: ? Make time to exercise each day. If you do   not have time for a long workout, do short bouts of exercise for 5-10 minutes several times a day. ? Do some form of strengthening exercise, like weight lifting, and some form of aerobic exercise, like running or swimming.  Drinking plenty of water or other low-calorie or no-calorie drinks. Drink 6-8 glasses of water daily, or as much as instructed by your health care provider.  Summary  Quitting smoking is a physical and mental challenge. You will face cravings, withdrawal symptoms, and temptation to smoke again. Preparation can help you as you go through these challenges.  You can cope with cravings by keeping your mouth busy (such as by chewing gum), keeping your body and hands busy, and making calls to family, friends, or a helpline for people who want to quit smoking.  You can cope with withdrawal symptoms by avoiding places where people smoke, avoiding drinks with caffeine, and getting plenty of rest.  Ask your health care provider about the different ways to prevent weight gain, avoid stress, and handle social situations. This information is not intended to replace advice given to you by your health care provider. Make  sure you discuss any questions you have with your health care provider. Document Released: 05/02/2016 Document Revised: 05/02/2016 Document Reviewed: 05/02/2016 Elsevier Interactive Patient Education  2018 Elsevier Inc.  

## 2018-04-27 NOTE — Progress Notes (Signed)
Established Patient Office Visit  Subjective:  Patient ID: Cameron PeaMichael Acri, male    DOB: 09/30/1965  Age: 52 y.o. MRN: [id number]  CC:  Chief Complaint  Patient presents with  . Follow-up    HPI Cameron Peters presents for follow up.  Presents for follow-up of his ADHD.  He has done well on his Adderall and is now taking it daily due to the stress of his job.  He is here for follow-up of acute from the phlebitis of his SMV.  This had involved acute abdominal pain with acute blood loss.  He has no history of DVT or PE.  There is no family history of DVT or PE.  He is a heavy smoker.  He is treated for attention deficit disorder.  He is in a high stress job.  Colonoscopy and EGD in the hospital did show esophagitis and diverticulosis.  He will follow-up with GI on 6 January.  There is uncertainty he will be required to remain on Xarelto.  He is having no issues taking iron twice a day.  His stools are black but he is having no constipation.  There is no longer bleeding and he is stooling.  Past Medical History:  Diagnosis Date  . Asthma   . Colitis   . 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 BIOPSY  04/07/2018   Procedure: BIOPSY;  Surgeon: Willis Modenautlaw, William, MD;  [facility];  Service: Endoscopy;;  . CHOLECYSTECTOMY    . COLONOSCOPY WITH PROPOFOL Left 04/07/2018   Procedure: COLONOSCOPY WITH PROPOFOL;  Surgeon: Willis Modenautlaw, William, MD;  [facility];  Service: Endoscopy;  Laterality: Left;  . ESOPHAGOGASTRODUODENOSCOPY (EGD) WITH PROPOFOL Left 04/07/2018   Procedure: ESOPHAGOGASTRODUODENOSCOPY (EGD) WITH PROPOFOL;  Surgeon: Willis Modenautlaw, William, MD;  [facility];  Service: Endoscopy;  Laterality: Left;  . SHOULDER SURGERY      History reviewed. No pertinent family history.  Social History   Socioeconomic History  . Marital status: Married    Spouse name: Not on file  . Number of  children: Not on file  . Years of education: Not on file  . Highest education level: Not on file  Occupational History  . Not on file  Social Needs  . Financial resource strain: Not on file  . Food insecurity:    Worry: Not on file    Inability: Not on file  . Transportation needs:    Medical: Not on file    Non-medical: Not on file  Tobacco Use  . Smoking status: Current Every Day Smoker    Packs/day: 1.00    Years: 25.00    Pack years: 25.00    Types: Cigarettes  . Smokeless tobacco: Never Used  Substance and Sexual Activity  . Alcohol use: No  . Drug use: No  . Sexual activity: Not on file  Lifestyle  . Physical activity:    Days per week: Not on file    Minutes per session: Not on file  . Stress: Not on file  Relationships  . Social connections:    Talks on phone: Not on file    Gets together: Not on file    Attends religious service: Not on file    Active member of club or organization: Not on file    Attends meetings of clubs or organizations: Not on file    Relationship status: Not on file  . Intimate partner violence:  Fear of current or ex partner: Not on file    Emotionally abused: Not on file    Physically abused: Not on file    Forced sexual activity: Not on file  Other Topics Concern  . Not on file  Social History Narrative  . Not on file    Outpatient Medications Prior to Visit  Medication Sig Dispense Refill  . albuterol (VENTOLIN HFA) 108 (90 Base) MCG/ACT inhaler INHALE 2 PUFFS INTO THE LUNGS EVERY 6 HOURS AS NEEDED FOR WHEEZING OR SHORTNESS OF BREATH (Patient taking differently: Inhale 1-2 puffs into the lungs every 6 (six) hours as needed for wheezing or shortness of breath. ) 18 g 1  . b complex vitamins capsule Take 1 capsule by mouth daily.    . ferrous sulfate 325 (65 FE) MG EC tablet Take 1 tablet (325 mg total) by mouth 2 (two) times daily. 60 tablet 0  . Omega-3 1000 MG CAPS Take 1,000 mg by mouth daily.    Marland Kitchen omega-3 acid ethyl esters  (LOVAZA) 1 g capsule Take 1 g by mouth daily.    . pantoprazole (PROTONIX) 40 MG tablet Take 1 tablet (40 mg total) by mouth daily. (Patient taking differently: Take 40 mg by mouth daily. prn) 30 tablet 0  . Probiotic Product (PROBIOTIC DAILY PO) Take 20 mg by mouth daily. 4 strain    . Rivaroxaban (XARELTO STARTER PACK) 15 & 20 MG TBPK Take as directed on package: Start with one 15mg  tablet by mouth twice a day with food. On Day 22, switch to one 20mg  tablet once a day with food. 51 each 0  . amphetamine-dextroamphetamine (ADDERALL XR) 30 MG 24 hr capsule Take 1 capsule (30 mg total) by mouth as needed. (Patient taking differently: Take 30 mg by mouth daily. ) 30 capsule 0  . nicotine (NICODERM CQ - DOSED IN MG/24 HOURS) 14 mg/24hr patch Place 1 patch (14 mg total) onto the skin daily. 28 patch 0   No facility-administered medications prior to visit.     Allergies  Allergen Reactions  . Penicillins Other (See Comments)    Childhood reaction - was told by parent a long time ago    ROS Review of Systems  Constitutional: Negative.   HENT: Negative.   Eyes: Negative for photophobia and visual disturbance.  Respiratory: Negative.   Cardiovascular: Negative.   Gastrointestinal: Negative for abdominal distention, abdominal pain, anal bleeding, blood in stool, constipation, nausea and vomiting.  Endocrine: Negative for polyphagia and polyuria.  Genitourinary: Negative.   Skin: Negative.   Allergic/Immunologic: Negative for immunocompromised state.  Neurological: Negative.   Hematological: Does not bruise/bleed easily.  Psychiatric/Behavioral: Negative.       Objective:    Physical Exam  Constitutional: He is oriented to person, place, and time. He appears well-developed and well-nourished. No distress.  HENT:  Head: Normocephalic and atraumatic.  Right Ear: External ear normal.  Left Ear: External ear normal.  Mouth/Throat: No oropharyngeal exudate.  Eyes: Conjunctivae are normal.  Right eye exhibits no discharge. Left eye exhibits no discharge. No scleral icterus.  Neck: Neck supple. No JVD present. No tracheal deviation present.  Pulmonary/Chest: Effort normal. No stridor.  Neurological: He is alert and oriented to person, place, and time.  Skin: He is not diaphoretic.  Psychiatric: His behavior is normal.    BP 128/80   Pulse (!) 101   Ht 5\' 9"  (1.753 m)   Wt 186 lb 4 oz (84.5 kg)   SpO2 99%  BMI 27.50 kg/m  Wt Readings from Last 3 Encounters:  04/27/18 186 lb 4 oz (84.5 kg)  04/03/18 195 lb (88.5 kg)  01/22/18 191 lb 6 oz (86.8 kg)   BP Readings from Last 3 Encounters:  04/27/18 128/80  04/10/18 (!) 154/87  01/22/18 (!) 142/90   Health Maintenance Due  Topic Date Due  . Janet Berlin  07/22/1983  . INFLUENZA VACCINE  12/17/2017    There are no preventive care reminders to display for this patient.  Lab Results  Component Value Date   TSH 2.11 08/07/2017   Lab Results  Component Value Date   WBC 8.2 04/10/2018   HGB 9.0 (L) 04/10/2018   HCT 30.6 (L) 04/10/2018   MCV 71.8 (L) 04/10/2018   PLT 482 (H) 04/10/2018   Lab Results  Component Value Date   NA 139 04/09/2018   K 3.8 04/09/2018   CO2 23 04/09/2018   GLUCOSE 108 (H) 04/09/2018   BUN 10 04/09/2018   CREATININE 1.02 04/09/2018   BILITOT 0.5 04/07/2018   ALKPHOS 205 (H) 04/07/2018   AST 75 (H) 04/07/2018   ALT 88 (H) 04/07/2018   PROT 6.1 (L) 04/07/2018   ALBUMIN 2.6 (L) 04/07/2018   CALCIUM 8.5 (L) 04/09/2018   ANIONGAP 6 04/09/2018   GFR 70.69 08/07/2017   Lab Results  Component Value Date   CHOL 122 04/03/2018   Lab Results  Component Value Date   HDL 15 (L) 04/03/2018   Lab Results  Component Value Date   LDLCALC 84 04/03/2018   Lab Results  Component Value Date   TRIG 115 04/03/2018   Lab Results  Component Value Date   CHOLHDL 8.1 04/03/2018   No results found for: HGBA1C    Assessment & Plan:   Problem List Items Addressed This Visit       Cardiovascular and Mediastinum   Mesenteric vein thrombosis     Other   Attention and concentration deficit - Primary   Relevant Medications   amphetamine-dextroamphetamine (ADDERALL XR) 30 MG 24 hr capsule   Tobacco use   Anemia   Relevant Orders   CBC   Iron, TIBC and Ferritin Panel   Vitamin B12      Meds ordered this encounter  Medications  . DISCONTD: amphetamine-dextroamphetamine (ADDERALL XR) 30 MG 24 hr capsule    Sig: Take 1 capsule (30 mg total) by mouth as needed.    Dispense:  30 capsule    Refill:  0    Fill in 60 days  . DISCONTD: amphetamine-dextroamphetamine (ADDERALL XR) 30 MG 24 hr capsule    Sig: Take 1 capsule (30 mg total) by mouth as needed.    Dispense:  30 capsule    Refill:  0    Fill in 30 days  . amphetamine-dextroamphetamine (ADDERALL XR) 30 MG 24 hr capsule    Sig: Take 1 capsule (30 mg total) by mouth as needed.    Dispense:  30 capsule    Refill:  0    Fill now    Follow-up: Return in about 3 months (around 07/27/2018).   Patient will be following up with gastroenterology on the sixth.  We will continue meds for attention deficit.  He will continue iron pending the results of his anemia studies today.  Emphasized the importance of stopping smoking.  He was given information on steps to quit smoking and coping with quitting smoking.  We discussed the significant nature of an unprovoked blood clot in his superior  mesenteric.  He may need Xarelto for the duration.  Hopefully my concern will not be corroborated by the gastroenterologist.

## 2018-04-28 LAB — IRON,TIBC AND FERRITIN PANEL
%SAT: 15 % (calc) — ABNORMAL LOW (ref 20–48)
Ferritin: 15 ng/mL — ABNORMAL LOW (ref 38–380)
IRON: 66 ug/dL (ref 50–180)
TIBC: 433 mcg/dL (calc) — ABNORMAL HIGH (ref 250–425)

## 2018-05-05 ENCOUNTER — Other Ambulatory Visit: Payer: Self-pay | Admitting: Family Medicine

## 2018-05-05 DIAGNOSIS — K55069 Acute infarction of intestine, part and extent unspecified: Secondary | ICD-10-CM

## 2018-05-05 DIAGNOSIS — I81 Portal vein thrombosis: Principal | ICD-10-CM

## 2018-05-05 MED ORDER — RIVAROXABAN 20 MG PO TABS: 20.0000 mg | ORAL_TABLET | Freq: Every day | ORAL | 1 refills | Status: AC

## 2018-05-05 NOTE — Telephone Encounter (Signed)
Copied from CRM [id number]. Topic: Quick Communication - Rx Refill/Question >> May 05, 2018  8:29 AM Burchel, Abbi R wrote: Medication: Rivaroxaban (XARELTO STARTER PACK) 15 & 20 MG TBPK  Pt's wife states pt was given this rx while in hosp, and will need a refill by Saturday.  Please advise. Pt's wife Nicholos Johns(Kathleen): [phone]  [id number] - [place], [place] - [address] [facility] [address] [place] [phone] Phone: [phone] Fax: [phone]    Pt was advised that RX refills may take up to 3 business days. We ask that you follow-up with your pharmacy.

## 2018-05-05 NOTE — Addendum Note (Signed)
Addended by: Andrez GrimeKREMER, WILLIAM A on: 05/05/2018 12:45 PM   Modules accepted: Orders

## 2018-05-05 NOTE — Telephone Encounter (Signed)
Requested medication (s) are due for refill today -yes  Requested medication (s) are on the active medication list -yes  Future visit scheduled -yes  Last refill: 04/10/18  Notes to clinic: Rx is for starter package- and is for print. Rx may need to be updated to maintenance dose Rx. Sent for provider review. Lab protocol fail.   Requested Prescriptions  Pending Prescriptions Disp Refills   Rivaroxaban (XARELTO STARTER PACK) 15 & 20 MG TBPK 51 each 0    Sig: Take as directed on package: Start with one 15mg  tablet by mouth twice a day with food. On Day 22, switch to one 20mg  tablet once a day with food.     Hematology: Anticoagulants - rivaroxaban Failed - 05/05/2018  8:37 AM      Failed - ALT in normal range and within 180 days    ALT  Date Value Ref Range Status  04/07/2018 88 (H) 0 - 44 U/L Final         Failed - AST in normal range and within 180 days    AST  Date Value Ref Range Status  04/07/2018 75 (H) 15 - 41 U/L Final         Passed - Cr in normal range and within 360 days    Creatinine, Ser  Date Value Ref Range Status  04/09/2018 1.02 0.61 - 1.24 mg/dL Final         Passed - HCT in normal range and within 360 days    HCT  Date Value Ref Range Status  04/27/2018 41.8 39.0 - 52.0 % Final         Passed - HGB in normal range and within 360 days    Hemoglobin  Date Value Ref Range Status  04/27/2018 13.0 13.0 - 17.0 g/dL Final         Passed - PLT in normal range and within 360 days    Platelets  Date Value Ref Range Status  04/27/2018 388.0 150.0 - 400.0 K/uL Final         Passed - Valid encounter within last 12 months    Recent Outpatient Visits          1 week ago Attention and concentration deficit   [facility] Doreene BurkeKremer, Talmadge CoventryWilliam Alfred, MD   3 months ago Attention and concentration deficit   [facility] Doreene BurkeKremer, Talmadge CoventryWilliam Alfred, MD   8 months ago Acute pain of left shoulder   [facility] [place] LikesSchmitz, Marye RoundJeremy E, MD   9 months ago Encounter for health maintenance examination with abnormal findings   [facility] Kremer, Talmadge CoventryWilliam Alfred, MD      Future Appointments            In 2 months Doreene BurkeKremer, Talmadge CoventryWilliam Alfred, MD [facility]            Requested Prescriptions  Pending Prescriptions Disp Refills   Rivaroxaban (XARELTO STARTER PACK) 15 & 20 MG TBPK 51 each 0    Sig: Take as directed on package: Start with one 15mg  tablet by mouth twice a day with food. On Day 22, switch to one 20mg  tablet once a day with food.     Hematology: Anticoagulants - rivaroxaban Failed - 05/05/2018  8:37 AM      Failed - ALT in normal range and within 180 days    ALT  Date Value Ref Range Status  04/07/2018 88 (H) 0 - 44 U/L  Final         Failed - AST in normal range and within 180 days    AST  Date Value Ref Range Status  04/07/2018 75 (H) 15 - 41 U/L Final         Passed - Cr in normal range and within 360 days    Creatinine, Ser  Date Value Ref Range Status  04/09/2018 1.02 0.61 - 1.24 mg/dL Final         Passed - HCT in normal range and within 360 days    HCT  Date Value Ref Range Status  04/27/2018 41.8 39.0 - 52.0 % Final         Passed - HGB in normal range and within 360 days    Hemoglobin  Date Value Ref Range Status  04/27/2018 13.0 13.0 - 17.0 g/dL Final         Passed - PLT in normal range and within 360 days    Platelets  Date Value Ref Range Status  04/27/2018 388.0 150.0 - 400.0 K/uL Final         Passed - Valid encounter within last 12 months    Recent Outpatient Visits          1 week ago Attention and concentration deficit   [facility] Doreene Burke, Talmadge Coventry, MD   3 months ago Attention and concentration deficit   [facility] Doreene Burke, Talmadge Coventry, MD   8 months ago Acute pain of left shoulder   [facility] Myra Rude, MD    9 months ago Encounter for health maintenance examination with abnormal findings   [facility] Ricks, Talmadge Coventry, MD      Future Appointments            In 2 months Doreene Burke, Talmadge Coventry, MD [facility]

## 2018-05-05 NOTE — Addendum Note (Signed)
Addended by: Redmond BasemanKELLER, Janeliz Prestwood K on: 05/05/2018 08:37 AM   Modules accepted: Orders

## 2018-05-27 ENCOUNTER — Telehealth: Payer: Self-pay

## 2018-05-27 DIAGNOSIS — I81 Portal vein thrombosis: Secondary | ICD-10-CM | POA: Diagnosis not present

## 2018-05-27 DIAGNOSIS — D649 Anemia, unspecified: Secondary | ICD-10-CM | POA: Diagnosis not present

## 2018-05-27 DIAGNOSIS — K55069 Acute infarction of intestine, part and extent unspecified: Secondary | ICD-10-CM

## 2018-05-27 NOTE — Telephone Encounter (Signed)
I left patient a voicemail letting him know that the referral has been placed.

## 2018-05-27 NOTE — Telephone Encounter (Signed)
Copied from CRM [id number]. Topic: General - Other >> May 27, 2018 12:26 PM Percival Spanish wrote:  Wife cal lto say pt is now anemia and they want him to see a blood doctor

## 2018-05-27 NOTE — Telephone Encounter (Signed)
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

## 2018-05-27 NOTE — Addendum Note (Signed)
Addended by: Marcell Anger E on: 05/27/2018 01:27 PM   Modules accepted: Orders

## 2018-05-31 ENCOUNTER — Telehealth: Payer: Self-pay | Admitting: Hematology & Oncology

## 2018-05-31 NOTE — Telephone Encounter (Signed)
Spoke with patient wife to sch new patient appt. Pt wife will have him call back to confirm the appt date/time per work sch

## 2018-06-24 ENCOUNTER — Inpatient Hospital Stay: Payer: Managed Care, Other (non HMO) | Attending: Hematology & Oncology | Admitting: Hematology & Oncology

## 2018-06-24 ENCOUNTER — Inpatient Hospital Stay: Payer: Managed Care, Other (non HMO)

## 2018-07-27 ENCOUNTER — Ambulatory Visit: Payer: BLUE CROSS/BLUE SHIELD | Admitting: Family Medicine

## 2018-07-27 ENCOUNTER — Encounter: Payer: Self-pay | Admitting: Family Medicine

## 2018-07-27 VITALS — BP 126/80 | HR 99 | Ht 69.0 in | Wt 192.0 lb

## 2018-07-27 DIAGNOSIS — D649 Anemia, unspecified: Secondary | ICD-10-CM | POA: Diagnosis not present

## 2018-07-27 DIAGNOSIS — Z720 Tobacco use: Secondary | ICD-10-CM

## 2018-07-27 DIAGNOSIS — I81 Portal vein thrombosis: Secondary | ICD-10-CM | POA: Diagnosis not present

## 2018-07-27 DIAGNOSIS — R41840 Attention and concentration deficit: Secondary | ICD-10-CM

## 2018-07-27 DIAGNOSIS — J4520 Mild intermittent asthma, uncomplicated: Secondary | ICD-10-CM | POA: Diagnosis not present

## 2018-07-27 DIAGNOSIS — K55069 Acute infarction of intestine, part and extent unspecified: Secondary | ICD-10-CM

## 2018-07-27 LAB — CBC
HCT: 47.5 % (ref 39.0–52.0)
Hemoglobin: 15.2 g/dL (ref 13.0–17.0)
MCHC: 32.1 g/dL (ref 30.0–36.0)
MCV: 79.6 fl (ref 78.0–100.0)
Platelets: 351 10*3/uL (ref 150.0–400.0)
RBC: 5.96 Mil/uL — AB (ref 4.22–5.81)
RDW: 16.9 % — ABNORMAL HIGH (ref 11.5–15.5)
WBC: 8.1 10*3/uL (ref 4.0–10.5)

## 2018-07-27 LAB — IRON,TIBC AND FERRITIN PANEL
%SAT: 5 % (calc) — ABNORMAL LOW (ref 20–48)
FERRITIN: 7 ng/mL — AB (ref 38–380)
IRON: 21 ug/dL — AB (ref 50–180)
TIBC: 424 ug/dL (ref 250–425)

## 2018-07-27 MED ORDER — AMPHETAMINE-DEXTROAMPHET ER 30 MG PO CP24: 30.0000 mg | ORAL_CAPSULE | ORAL | 0 refills | Status: DC | PRN

## 2018-07-27 MED ORDER — ALBUTEROL SULFATE 108 (90 BASE) MCG/ACT IN AEPB: 1.0000 | INHALATION_SPRAY | Freq: Four times a day (QID) | RESPIRATORY_TRACT | 1 refills | Status: AC | PRN

## 2018-07-27 NOTE — Patient Instructions (Signed)
Varenicline oral tablets What is this medicine? VARENICLINE (var EN i kleen) is used to help people quit smoking. It is used with a patient support program recommended by your physician. This medicine may be used for other purposes; ask your health care provider or pharmacist if you have questions. COMMON BRAND NAME(S): Chantix What should I tell my health care provider before I take this medicine? They need to know if you have any of these conditions: -heart disease -if you often drink alcohol -kidney disease -mental illness -on hemodialysis -seizures -history of stroke -suicidal thoughts, plans, or attempt; a previous suicide attempt by you or a family member -an unusual or allergic reaction to varenicline, other medicines, foods, dyes, or preservatives -pregnant or trying to get pregnant -breast-feeding How should I use this medicine? Take this medicine by mouth after eating. Take with a full glass of water. Follow the directions on the prescription label. Take your doses at regular intervals. Do not take your medicine more often than directed. There are 3 ways you can use this medicine to help you quit smoking; talk to your health care professional to decide which plan is right for you: 1) you can choose a quit date and start this medicine 1 week before the quit date, or, 2) you can start taking this medicine before you choose a quit date, and then pick a quit date between day 8 and 35 days of treatment, or, 3) if you are not sure that you are able or willing to quit smoking right away, start taking this medicine and slowly decrease the amount you smoke as directed by your health care professional with the goal of being cigarette-free by week 12 of treatment. Stick to your plan; ask about support groups or other ways to help you remain cigarette-free. If you are motivated to quit smoking and did not succeed during a previous attempt with this medicine for reasons other than side effects,  or if you returned to smoking after this treatment, speak with your health care professional about whether another course of this medicine may be right for you. A special MedGuide will be given to you by the pharmacist with each prescription and refill. Be sure to read this information carefully each time. Talk to your pediatrician regarding the use of this medicine in children. This medicine is not approved for use in children. Overdosage: If you think you have taken too much of this medicine contact a poison control center or emergency room at once. NOTE: This medicine is only for you. Do not share this medicine with others. What if I miss a dose? If you miss a dose, take it as soon as you can. If it is almost time for your next dose, take only that dose. Do not take double or extra doses. What may interact with this medicine? -alcohol -insulin -other medicines used to help people quit smoking -theophylline -warfarin This list may not describe all possible interactions. Give your health care provider a list of all the medicines, herbs, non-prescription drugs, or dietary supplements you use. Also tell them if you smoke, drink alcohol, or use illegal drugs. Some items may interact with your medicine. What should I watch for while using this medicine? It is okay if you do not succeed at your attempt to quit and have a cigarette. You can still continue your quit attempt and keep using this medicine as directed. Just throw away your cigarettes and get back to your quit plan. Talk to your health   care provider before using other treatments to quit smoking. Using this medicine with other treatments to quit smoking may increase the risk for side effects compared to using a treatment alone. You may get drowsy or dizzy. Do not drive, use machinery, or do anything that needs mental alertness until you know how this medicine affects you. Do not stand or sit up quickly, especially if you are an older patient.  This reduces the risk of dizzy or fainting spells. Decrease the number of alcoholic beverages that you drink during treatment with this medicine until you know if this medicine affects your ability to tolerate alcohol. Some people have experienced increased drunkenness (intoxication), unusual or sometimes aggressive behavior, or no memory of things that have happened (amnesia) during treatment with this medicine. Sleepwalking can happen during treatment with this medicine, and can sometimes lead to behavior that is harmful to you, other people, or property. Stop taking this medicine and tell your doctor if you start sleepwalking or have other unusual sleep-related activity. After taking this medicine, you may get up out of bed and do an activity that you do not know you are doing. The next morning, you may have no memory of this. Activities include driving a car ("sleep-driving"), making and eating food, talking on the phone, sexual activity, and sleep-walking. Serious injuries have occurred. Stop the medicine and call your doctor right away if you find out you have done any of these activities. Do not take this medicine if you have used alcohol that evening. Do not take it if you have taken another medicine for sleep. The risk of doing these sleep-related activities is higher. Patients and their families should watch out for new or worsening depression or thoughts of suicide. Also watch out for sudden changes in feelings such as feeling anxious, agitated, panicky, irritable, hostile, aggressive, impulsive, severely restless, overly excited and hyperactive, or not being able to sleep. If this happens, call your health care professional. If you have diabetes and you quit smoking, the effects of insulin may be increased and you may need to reduce your insulin dose. Check with your doctor or health care professional about how you should adjust your insulin dose. What side effects may I notice from receiving this  medicine? Side effects that you should report to your doctor or health care professional as soon as possible: -allergic reactions like skin rash, itching or hives, swelling of the face, lips, tongue, or throat -acting aggressive, being angry or violent, or acting on dangerous impulses -breathing problems -changes in emotions or moods -chest pain or chest tightness -feeling faint or lightheaded, falls -hallucination, loss of contact with reality -mouth sores -redness, blistering, peeling or loosening of the skin, including inside the mouth -signs and symptoms of a stroke like changes in vision; confusion; trouble speaking or understanding; severe headaches; sudden numbness or weakness of the face, arm or leg; trouble walking; dizziness; loss of balance or coordination -seizures -sleepwalking -suicidal thoughts or other mood changes Side effects that usually do not require medical attention (report to your doctor or health care professional if they continue or are bothersome): -constipation -gas -headache -nausea, vomiting -strange dreams -trouble sleeping This list may not describe all possible side effects. Call your doctor for medical advice about side effects. You may report side effects to FDA at 1-800-FDA-1088. Where should I keep my medicine? Keep out of the reach of children. Store at room temperature between 15 and 30 degrees C (59 and 86 degrees F). Throw away   any unused medicine after the expiration date. NOTE: This sheet is a summary. It may not cover all possible information. If you have questions about this medicine, talk to your doctor, pharmacist, or health care provider.  2019 Elsevier/Gold Standard (2017-10-30 12:48:08)  Coping with Quitting Smoking  Quitting smoking is a physical and mental challenge. You will face cravings, withdrawal symptoms, and temptation. Before quitting, work with your health care provider to make a plan that can help you cope. Preparation can  help you quit and keep you from giving in. How can I cope with cravings? Cravings usually last for 5-10 minutes. If you get through it, the craving will pass. Consider taking the following actions to help you cope with cravings:  Keep your mouth busy: ? Chew sugar-free gum. ? Suck on hard candies or a straw. ? Brush your teeth.  Keep your hands and body busy: ? Immediately change to a different activity when you feel a craving. ? Squeeze or play with a ball. ? Do an activity or a hobby, like making bead jewelry, practicing needlepoint, or working with wood. ? Mix up your normal routine. ? Take a short exercise break. Go for a quick walk or run up and down stairs. ? Spend time in public places where smoking is not allowed.  Focus on doing something kind or helpful for someone else.  Call a friend or family member to talk during a craving.  Join a support group.  Call a quit line, such as 1-800-QUIT-NOW.  Talk with your health care provider about medicines that might help you cope with cravings and make quitting easier for you. How can I deal with withdrawal symptoms? Your body may experience negative effects as it tries to get used to not having nicotine in the system. These effects are called withdrawal symptoms. They may include:  Feeling hungrier than normal.  Trouble concentrating.  Irritability.  Trouble sleeping.  Feeling depressed.  Restlessness and agitation.  Craving a cigarette. To manage withdrawal symptoms:  Avoid places, people, and activities that trigger your cravings.  Remember why you want to quit.  Get plenty of sleep.  Avoid coffee and other caffeinated drinks. These may worsen some of your symptoms. How can I handle social situations? Social situations can be difficult when you are quitting smoking, especially in the first few weeks. To manage this, you can:  Avoid parties, bars, and other social situations where people might be  smoking.  Avoid alcohol.  Leave right away if you have the urge to smoke.  Explain to your family and friends that you are quitting smoking. Ask for understanding and support.  Plan activities with friends or family where smoking is not an option. What are some ways I can cope with stress? Wanting to smoke may cause stress, and stress can make you want to smoke. Find ways to manage your stress. Relaxation techniques can help. For example:  Breathe slowly and deeply, in through your nose and out through your mouth.  Listen to soothing, relaxing music.  Talk with a family member or friend about your stress.  Light a candle.  Soak in a bath or take a shower.  Think about a peaceful place. What are some ways I can prevent weight gain? Be aware that many people gain weight after they quit smoking. However, not everyone does. To keep from gaining weight, have a plan in place before you quit and stick to the plan after you quit. Your plan should include:    Having healthy snacks. When you have a craving, it may help to: ? Eat plain popcorn, crunchy carrots, celery, or other cut vegetables. ? Chew sugar-free gum.  Changing how you eat: ? Eat small portion sizes at meals. ? Eat 4-6 small meals throughout the day instead of 1-2 large meals a day. ? Be mindful when you eat. Do not watch television or do other things that might distract you as you eat.  Exercising regularly: ? Make time to exercise each day. If you do not have time for a long workout, do short bouts of exercise for 5-10 minutes several times a day. ? Do some form of strengthening exercise, like weight lifting, and some form of aerobic exercise, like running or swimming.  Drinking plenty of water or other low-calorie or no-calorie drinks. Drink 6-8 glasses of water daily, or as much as instructed by your health care provider. Summary  Quitting smoking is a physical and mental challenge. You will face cravings, withdrawal  symptoms, and temptation to smoke again. Preparation can help you as you go through these challenges.  You can cope with cravings by keeping your mouth busy (such as by chewing gum), keeping your body and hands busy, and making calls to family, friends, or a helpline for people who want to quit smoking.  You can cope with withdrawal symptoms by avoiding places where people smoke, avoiding drinks with caffeine, and getting plenty of rest.  Ask your health care provider about the different ways to prevent weight gain, avoid stress, and handle social situations. This information is not intended to replace advice given to you by your health care provider. Make sure you discuss any questions you have with your health care provider. Document Released: 05/02/2016 Document Revised: 05/02/2016 Document Reviewed: 05/02/2016 Elsevier Interactive Patient Education  2019 [employer]  

## 2018-07-27 NOTE — Progress Notes (Signed)
Established Patient Office Visit  Subjective:  Patient ID: Cameron Peters, male    DOB: 1965/06/28  Age: 53 y.o. MRN: [id number]  CC:  Chief Complaint  Patient presents with  . Follow-up    HPI Cameron Peters presents for follow-up of his ADD with Adderall therapy.  Patient is taking the Adderall daily and says that it is helping a great deal with his ability to concentrate on his stressful job.  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  He is no longer bleeding per rectum.  He stopped his iron pills a few weeks ago.  There is been no bleeding per rectum or melena.  Denies abdominal pain.  Uses his Ventolin inhaler as needed for wheezing.  Tells me that he uses it once or twice weekly.  Has not thought much about quitting smoking.  When he was in the hospital for couple of weeks they put him on the patch and that actually had worked well for him.  Past Medical History:  Diagnosis Date  . Asthma   . Colitis   . 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 BIOPSY  04/07/2018   Procedure: BIOPSY;  Surgeon: Willis Modena, MD;  Location: [facility] ENDOSCOPY;  Service: Endoscopy;;  . CHOLECYSTECTOMY    . COLONOSCOPY WITH PROPOFOL Left 04/07/2018   Procedure: COLONOSCOPY WITH PROPOFOL;  Surgeon: Willis Modena, MD;  Location: [facility] ENDOSCOPY;  Service: Endoscopy;  Laterality: Left;  . ESOPHAGOGASTRODUODENOSCOPY (EGD) WITH PROPOFOL Left 04/07/2018   Procedure: ESOPHAGOGASTRODUODENOSCOPY (EGD) WITH PROPOFOL;  Surgeon: Willis Modena, MD;  Location: [facility] ENDOSCOPY;  Service: Endoscopy;  Laterality: Left;  . SHOULDER SURGERY      History reviewed. No pertinent family history.  Social History   Socioeconomic History  . Marital status: Married    Spouse name: Not on file  . Number of children: Not on file  . Years of education: Not on file  . Highest education level: Not  on file  Occupational History  . Not on file  Social Needs  . Financial resource strain: Not on file  . Food insecurity:    Worry: Not on file    Inability: Not on file  . Transportation needs:    Medical: Not on file    Non-medical: Not on file  Tobacco Use  . Smoking status: Current Every Day Smoker    Packs/day: 1.00    Years: 25.00    Pack years: 25.00    Types: Cigarettes  . Smokeless tobacco: Never Used  Substance and Sexual Activity  . Alcohol use: No  . Drug use: No  . Sexual activity: Not on file  Lifestyle  . Physical activity:    Days per week: Not on file    Minutes per session: Not on file  . Stress: Not on file  Relationships  . Social connections:    Talks on phone: Not on file    Gets together: Not on file    Attends religious service: Not on file    Active member of club or organization: Not on file    Attends meetings of clubs or organizations: Not on file    Relationship status: Not on file  . Intimate partner violence:    Fear of current or ex partner: Not on file    Emotionally abused: Not on file    Physically abused: Not on file    Forced  sexual activity: Not on file  Other Topics Concern  . Not on file  Social History Narrative  . Not on file    Outpatient Medications Prior to Visit  Medication Sig Dispense Refill  . b complex vitamins capsule Take 1 capsule by mouth daily.    . ferrous sulfate 325 (65 FE) MG EC tablet Take 1 tablet (325 mg total) by mouth 2 (two) times daily. 60 tablet 0  . Omega-3 1000 MG CAPS Take 1,000 mg by mouth daily.    Marland Kitchen. omega-3 acid ethyl esters (LOVAZA) 1 g capsule Take 1 g by mouth daily.    . pantoprazole (PROTONIX) 40 MG tablet Take 1 tablet (40 mg total) by mouth daily. (Patient taking differently: Take 40 mg by mouth daily. prn) 30 tablet 0  . Probiotic Product (PROBIOTIC DAILY PO) Take 20 mg by mouth daily. 4 strain    . rivaroxaban (XARELTO) 20 MG TABS tablet Take 1 tablet (20 mg total) by mouth daily  with supper. 90 tablet 1  . albuterol (VENTOLIN HFA) 108 (90 Base) MCG/ACT inhaler INHALE 2 PUFFS INTO THE LUNGS EVERY 6 HOURS AS NEEDED FOR WHEEZING OR SHORTNESS OF BREATH (Patient taking differently: Inhale 1-2 puffs into the lungs every 6 (six) hours as needed for wheezing or shortness of breath. ) 18 g 1  . amphetamine-dextroamphetamine (ADDERALL XR) 30 MG 24 hr capsule Take 1 capsule (30 mg total) by mouth as needed. 30 capsule 0   No facility-administered medications prior to visit.     Allergies  Allergen Reactions  . Penicillins Other (See Comments)    Childhood reaction - was told by parent a long time ago    ROS Review of Systems  Constitutional: Negative.   HENT: Negative.   Respiratory: Negative.   Cardiovascular: Negative.   Gastrointestinal: Negative for abdominal pain, anal bleeding, blood in stool, nausea and vomiting.  Genitourinary: Negative.   Musculoskeletal: Negative for gait problem and joint swelling.  Skin: Negative for pallor and rash.  Allergic/Immunologic: Negative for immunocompromised state.  Neurological: Negative for light-headedness and headaches.  Hematological: Does not bruise/bleed easily.  Psychiatric/Behavioral: Negative for behavioral problems and decreased concentration.      Objective:    Physical Exam  Constitutional: He is oriented to person, place, and time. He appears well-developed and well-nourished. No distress.  HENT:  Head: Normocephalic and atraumatic.  Right Ear: External ear normal.  Left Ear: External ear normal.  Eyes: Pupils are equal, round, and reactive to light. Right eye exhibits no discharge. Left eye exhibits no discharge. No scleral icterus.  Neck: No JVD present. No tracheal deviation present.  Cardiovascular: Normal rate, regular rhythm and normal heart sounds.  Pulmonary/Chest: Effort normal and breath sounds normal. No stridor. No respiratory distress. He has no wheezes. He has no rales.  Abdominal: Bowel  sounds are normal.  Neurological: He is alert and oriented to person, place, and time.  Skin: Skin is warm and dry. He is not diaphoretic.  Psychiatric: He has a normal mood and affect. His behavior is normal.    BP 126/80   Pulse 99   Ht 5\' 9"  (1.753 m)   Wt 192 lb (87.1 kg)   SpO2 98%   BMI 28.35 kg/m  Wt Readings from Last 3 Encounters:  07/27/18 192 lb (87.1 kg)  04/27/18 186 lb 4 oz (84.5 kg)  04/03/18 195 lb (88.5 kg)   BP Readings from Last 3 Encounters:  07/27/18 126/80  04/27/18 128/80  04/10/18 Marland Kitchen(!)  154/87   Guideline developer:  UpToDate (see UpToDate for funding source) Date Released: June 2014  Health Maintenance Due  Topic Date Due  . TETANUS/TDAP  07/22/1983  . INFLUENZA VACCINE  12/17/2017    There are no preventive care reminders to display for this patient.  Lab Results  Component Value Date   TSH 2.11 08/07/2017   Lab Results  Component Value Date   WBC 7.4 04/27/2018   HGB 13.0 04/27/2018   HCT 41.8 04/27/2018   MCV 73.0 (L) 04/27/2018   PLT 388.0 04/27/2018   Lab Results  Component Value Date   NA 139 04/09/2018   K 3.8 04/09/2018   CO2 23 04/09/2018   GLUCOSE 108 (H) 04/09/2018   BUN 10 04/09/2018   CREATININE 1.02 04/09/2018   BILITOT 0.5 04/07/2018   ALKPHOS 205 (H) 04/07/2018   AST 75 (H) 04/07/2018   ALT 88 (H) 04/07/2018   PROT 6.1 (L) 04/07/2018   ALBUMIN 2.6 (L) 04/07/2018   CALCIUM 8.5 (L) 04/09/2018   ANIONGAP 6 04/09/2018   GFR 70.69 08/07/2017   Lab Results  Component Value Date   CHOL 122 04/03/2018   Lab Results  Component Value Date   HDL 15 (L) 04/03/2018   Lab Results  Component Value Date   LDLCALC 84 04/03/2018   Lab Results  Component Value Date   TRIG 115 04/03/2018   Lab Results  Component Value Date   CHOLHDL 8.1 04/03/2018   No results found for: HGBA1C    Assessment & Plan:   Problem List Items Addressed This Visit      Cardiovascular and Mediastinum   Mesenteric vein thrombosis      Respiratory   Asthma   Relevant Medications   Albuterol Sulfate (PROAIR RESPICLICK) 108 (90 Base) MCG/ACT AEPB     Other   Attention and concentration deficit - Primary   Relevant Medications   amphetamine-dextroamphetamine (ADDERALL XR) 30 MG 24 hr capsule   Tobacco use   Anemia   Relevant Orders   CBC   Iron, TIBC and Ferritin Panel      Meds ordered this encounter  Medications  . DISCONTD: amphetamine-dextroamphetamine (ADDERALL XR) 30 MG 24 hr capsule    Sig: Take 1 capsule (30 mg total) by mouth as needed.    Dispense:  30 capsule    Refill:  0    Fill now  . DISCONTD: amphetamine-dextroamphetamine (ADDERALL XR) 30 MG 24 hr capsule    Sig: Take 1 capsule (30 mg total) by mouth as needed.    Dispense:  30 capsule    Refill:  0    Fill in 30 days  . amphetamine-dextroamphetamine (ADDERALL XR) 30 MG 24 hr capsule    Sig: Take 1 capsule (30 mg total) by mouth as needed.    Dispense:  30 capsule    Refill:  0    Fill in 60 days  . Albuterol Sulfate (PROAIR RESPICLICK) 108 (90 Base) MCG/ACT AEPB    Sig: Inhale 1 puff into the lungs 4 (four) times daily as needed.    Dispense:  1 each    Refill:  1    Follow-up: Return in about 3 months (around 10/27/2018).   Spent 5 minutes counseling the patient to discontinue tobacco.  We discussed using Chantix and he was given information on Chantix.  Suggested that he retry the patch as was given to him in his last hospitalization.  He is to follow-up with hematology oncology to check  the duration of Xarelto therapy advised him to stay on the drug until he consulted with them.  Rechecking hemoglobin and iron levels today.

## 2018-08-11 ENCOUNTER — Ambulatory Visit: Payer: Self-pay

## 2018-08-11 NOTE — Telephone Encounter (Signed)
Pt wife called to report that her husband came home with nausea sore throat, stuffy nose.  Pt reports feeling chilled and hot. No fever per temperature check while talking with NT. Pt is unsure of exposure to COVID-19.  He reports that his boss traveled to [place] 2 weeks ago.  He says he may have had 10 minutes with her a couple time a day but always 6 feet apart. Pt read home care advice per CDC. Pt advised to go to ER for SOB and chest pain. Note will be routed to office for further evaluation. Pt is requesting tele appointment.  He does not want to give his E-mail  Reason for Disposition . [1] Caller concerned that exposure to COVID-19 occurred BUT [2] does not meet COVID-19 EXPOSURE criteria from CDC . [1] COVID-19 EXPOSURE within last 14 days AND [2] mild body aches, chills, diarrhea, headache, runny nose, or sore throat occur  Answer Assessment - Initial Assessment Questions 1. 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 PLACE of CONTACT: "Where were you when you were exposed to COVID-19  (coronavirus disease 2019)?" (e.g., city, state, country)     Work place 3. TYPE of CONTACT: "How much contact was there?" (e.g., live in same house, work in same office, same school)     Work in same office 4. DATE of CONTACT: "When did you have contact with a coronavirus patient?" (e.g., days)    1 weeks ago 5. DURATION of CONTACT: "How long were you in contact with the COVID-19 (coronavirus disease) patient?" (e.g., a few seconds, passed by person, a few minutes, live with the patient)     10 minutes definetly 6 feet away 6. SYMPTOMS: "Do you have any symptoms?" (e.g., fever, cough, breathing difficulty)     Fell hot then chilled stuffy nose sore throat dizzy  No fever 7. PREGNANCY OR POSTPARTUM: "Is there any chance you are pregnant?" "When was your last menstrual period?" "Did you deliver in the last 2 weeks?"     N/A 8. HIGH RISK: "Do you have any heart or lung problems? Do you have a weakened immune system?" (e.g., CHF, COPD, asthma, HIV positive, chemotherapy, renal failure, diabetes mellitus, sickle cell anemia)     Asthma,  Protocols used: CORONAVIRUS (COVID-19) EXPOSURE-A-AH

## 2018-08-12 NOTE — Telephone Encounter (Signed)
I left patient a voicemail to call back to schedule televisit for today. Okay for PEC to schedule patient. 

## 2018-08-12 NOTE — Telephone Encounter (Signed)
Set up for WebEx.  

## 2018-08-16 NOTE — Telephone Encounter (Signed)
I left patient a voicemail to call back to schedule televisit for today. Okay for PEC to schedule patient. 

## 2018-08-18 ENCOUNTER — Telehealth: Payer: Self-pay

## 2018-08-18 NOTE — Telephone Encounter (Signed)
I left patient a voicemail to call back to schedule televisit for today. Okay for P[facility] to schedule patient.

## 2018-08-18 NOTE — Telephone Encounter (Signed)
Copied from CRM [id number]. Topic: General - Call Back - No Documentation >> Aug 18, 2018 12:00 PM Rica Koyanagi, Melissa J wrote: Reason for CRM: pt said he is returning call . Please call [phone]  08/18/2018  Called Pt . He is scheduled for Webex on 08/19/2018 @ 8 am . Pt is aware of process and email was given . E-mail address given was : [email or web address]

## 2018-08-18 NOTE — Telephone Encounter (Signed)
Attempted to contact patient & LMOVM x 3 times. Will close phone note. Okay for patient to schedule televisit or webex.

## 2018-08-19 ENCOUNTER — Encounter: Payer: Self-pay | Admitting: Family Medicine

## 2018-08-19 ENCOUNTER — Ambulatory Visit (INDEPENDENT_AMBULATORY_CARE_PROVIDER_SITE_OTHER): Payer: BLUE CROSS/BLUE SHIELD | Admitting: Family Medicine

## 2018-08-19 VITALS — Ht 69.0 in

## 2018-08-19 DIAGNOSIS — B349 Viral infection, unspecified: Secondary | ICD-10-CM | POA: Insufficient documentation

## 2018-08-19 NOTE — Progress Notes (Addendum)
Virtual Visit via Video Note  I connected with Cameron Peters on 08/19/18 at  8:00 AM EDT by a video enabled telemedicine application and verified that I am speaking with the correct person using two identifiers.   I discussed the limitations of evaluation and management by telemedicine and the availability of in person appointments. The patient expressed understanding and agreed to proceed.  Interactive video and audio telecommunications were attempted between myself and the patient. However they failed due to the patient having technical difficulties or not having access to video capability. We continued and completed with audio only.  History of Present Illness:Possible covid exposure 2-3 days prior to onset of uri symptoms. There was pnd, [place] cough with some sob.  Duration of illness was 7 days and now patient is feeling much better without lingering symptoms.     Observations/Objective:nad without dyspnea and alert and oriented.  Assessment and Plan:  1. Viral syndrome  continue isolating for another 5 days.   Follow Up Instructions:returnt in one week if not continuing to improve. Discontinue tobacco!   I discussed the assessment and treatment plan with the patient. The patient was provided an opportunity to ask questions and all were answered. The patient agreed with the plan and demonstrated an understanding of the instructions.   The patient was advised to call back or seek an in-person evaluation if the symptoms worsen or if the condition fails to improve as anticipated.  I provided 14  minutes of non-face-to-face time during this encounter.

## 2018-10-20 ENCOUNTER — Ambulatory Visit: Payer: BLUE CROSS/BLUE SHIELD | Admitting: Family Medicine

## 2018-10-25 ENCOUNTER — Ambulatory Visit (INDEPENDENT_AMBULATORY_CARE_PROVIDER_SITE_OTHER): Payer: BC Managed Care – PPO | Admitting: Family Medicine

## 2018-10-25 ENCOUNTER — Encounter: Payer: Self-pay | Admitting: Family Medicine

## 2018-10-25 DIAGNOSIS — K55069 Acute infarction of intestine, part and extent unspecified: Secondary | ICD-10-CM

## 2018-10-25 DIAGNOSIS — Z720 Tobacco use: Secondary | ICD-10-CM | POA: Diagnosis not present

## 2018-10-25 DIAGNOSIS — D649 Anemia, unspecified: Secondary | ICD-10-CM | POA: Diagnosis not present

## 2018-10-25 DIAGNOSIS — I81 Portal vein thrombosis: Secondary | ICD-10-CM | POA: Diagnosis not present

## 2018-10-25 DIAGNOSIS — R41840 Attention and concentration deficit: Secondary | ICD-10-CM

## 2018-10-25 MED ORDER — AMPHETAMINE-DEXTROAMPHET ER 30 MG PO CP24: 30.0000 mg | ORAL_CAPSULE | ORAL | 0 refills | Status: DC

## 2018-10-25 MED ORDER — AMPHETAMINE-DEXTROAMPHET ER 30 MG PO CP24: 30.0000 mg | ORAL_CAPSULE | ORAL | 0 refills | Status: DC | PRN

## 2018-10-25 NOTE — Progress Notes (Signed)
Established Patient Office Visit  Subjective:  Patient ID: Cameron Peters, male    DOB: 07/09/1965  Age: 53 y.o. MRN: [id number]  CC:  Chief Complaint  Patient presents with  . Follow-up    HPI Cameron PeaMichael Dacosta presents for follow-up of his attention deficit.  He is taking Adderall daily.  His job is gotten a lot more stressful forming a wakeful point of view.  He has more responsibility ability with detailed information involving payroll and needs to take the medication daily.  He is no longer traveling and works at home a great deal.  Has not been able to see the oncologist and I reminded him that we needed their guidance with regards to length of treatment for Xarelto.  Past Medical History:  Diagnosis Date  . Asthma   . Colitis   . 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 BIOPSY  04/07/2018   Procedure: BIOPSY;  Surgeon: Willis Modenautlaw, William, MD;  [facility];  Service: Endoscopy;;  . CHOLECYSTECTOMY    . COLONOSCOPY WITH PROPOFOL Left 04/07/2018   Procedure: COLONOSCOPY WITH PROPOFOL;  Surgeon: Willis Modenautlaw, William, MD;  [facility];  Service: Endoscopy;  Laterality: Left;  . ESOPHAGOGASTRODUODENOSCOPY (EGD) WITH PROPOFOL Left 04/07/2018   Procedure: ESOPHAGOGASTRODUODENOSCOPY (EGD) WITH PROPOFOL;  Surgeon: Willis Modenautlaw, William, MD;  [facility];  Service: Endoscopy;  Laterality: Left;  . SHOULDER SURGERY      History reviewed. No pertinent family history.  Social History   Socioeconomic History  . Marital status: Married    Spouse name: Not on file  . Number of children: Not on file  . Years of education: Not on file  . Highest education level: Not on file  Occupational History  . Not on file  Social Needs  . Financial resource strain: Not on file  . Food insecurity:    Worry: Not on file    Inability: Not on file  . Transportation needs:    Medical: Not on file   Non-medical: Not on file  Tobacco Use  . Smoking status: Current Every Day Smoker    Packs/day: 1.00    Years: 25.00    Pack years: 25.00    Types: Cigarettes  . Smokeless tobacco: Never Used  Substance and Sexual Activity  . Alcohol use: No  . Drug use: No  . Sexual activity: Not on file  Lifestyle  . Physical activity:    Days per week: Not on file    Minutes per session: Not on file  . Stress: Not on file  Relationships  . Social connections:    Talks on phone: Not on file    Gets together: Not on file    Attends religious service: Not on file    Active member of club or organization: Not on file    Attends meetings of clubs or organizations: Not on file    Relationship status: Not on file  . Intimate partner violence:    Fear of current or ex partner: Not on file    Emotionally abused: Not on file    Physically abused: Not on file    Forced sexual activity: Not on file  Other Topics Concern  . Not on file  Social History Narrative  . Not on file    Outpatient Medications Prior to Visit  Medication Sig Dispense Refill  . Albuterol Sulfate (PROAIR RESPICLICK) 108 (90 Base) MCG/ACT AEPB Inhale 1 puff into  the lungs 4 (four) times daily as needed. 1 each 1  . b complex vitamins capsule Take 1 capsule by mouth daily.    . ferrous sulfate 325 (65 FE) MG EC tablet Take 1 tablet (325 mg total) by mouth 2 (two) times daily. 60 tablet 0  . Omega-3 1000 MG CAPS Take 1,000 mg by mouth daily.    Marland Kitchen. omega-3 acid ethyl esters (LOVAZA) 1 g capsule Take 1 g by mouth daily.    . pantoprazole (PROTONIX) 40 MG tablet Take 1 tablet (40 mg total) by mouth daily. (Patient taking differently: Take 40 mg by mouth daily. prn) 30 tablet 0  . Probiotic Product (PROBIOTIC DAILY PO) Take 20 mg by mouth daily. 4 strain    . rivaroxaban (XARELTO) 20 MG TABS tablet Take 1 tablet (20 mg total) by mouth daily with supper. 90 tablet 1  . amphetamine-dextroamphetamine (ADDERALL XR) 30 MG 24 hr capsule  Take 1 capsule (30 mg total) by mouth as needed. 30 capsule 0   No facility-administered medications prior to visit.     Allergies  Allergen Reactions  . Penicillins Other (See Comments)    Childhood reaction - was told by parent a long time ago    ROS Review of Systems  Constitutional: Negative.   Respiratory: Negative.   Cardiovascular: Negative.   Gastrointestinal: Negative.  Negative for anal bleeding and blood in stool.  Genitourinary: Negative for hematuria.      Objective:    Physical Exam  Constitutional: He appears well-developed and well-nourished. No distress.  HENT:  Head: Normocephalic and atraumatic.  Right Ear: External ear normal.  Left Ear: External ear normal.  Eyes: Conjunctivae are normal. Right eye exhibits no discharge. Left eye exhibits no discharge. No scleral icterus.  Pulmonary/Chest: Effort normal.  Neurological: He is alert.  Skin: He is not diaphoretic.  Psychiatric: He has a normal mood and affect. His behavior is normal.    There were no vitals taken for this visit. Wt Readings from Last 3 Encounters:  07/27/18 192 lb (87.1 kg)  04/27/18 186 lb 4 oz (84.5 kg)  04/03/18 195 lb (88.5 kg)   BP Readings from Last 3 Encounters:  07/27/18 126/80  04/27/18 128/80  04/10/18 (!) 154/87   Guideline developer:  UpToDate (see UpToDate for funding source) Date Released: June 2014  Health Maintenance Due  Topic Date Due  . Janet BerlinETANUS/TDAP  07/22/1983    There are no preventive care reminders to display for this patient.  Lab Results  Component Value Date   TSH 2.11 08/07/2017   Lab Results  Component Value Date   WBC 8.1 07/27/2018   HGB 15.2 07/27/2018   HCT 47.5 07/27/2018   MCV 79.6 07/27/2018   PLT 351.0 07/27/2018   Lab Results  Component Value Date   NA 139 04/09/2018   K 3.8 04/09/2018   CO2 23 04/09/2018   GLUCOSE 108 (H) 04/09/2018   BUN 10 04/09/2018   CREATININE 1.02 04/09/2018   BILITOT 0.5 04/07/2018   ALKPHOS  205 (H) 04/07/2018   AST 75 (H) 04/07/2018   ALT 88 (H) 04/07/2018   PROT 6.1 (L) 04/07/2018   ALBUMIN 2.6 (L) 04/07/2018   CALCIUM 8.5 (L) 04/09/2018   ANIONGAP 6 04/09/2018   GFR 70.69 08/07/2017   Lab Results  Component Value Date   CHOL 122 04/03/2018   Lab Results  Component Value Date   HDL 15 (L) 04/03/2018   Lab Results  Component Value Date  [place] 84 04/03/2018   Lab Results  Component Value Date   TRIG 115 04/03/2018   Lab Results  Component Value Date   CHOLHDL 8.1 04/03/2018   No results found for: HGBA1C    Assessment & Plan:   Problem List Items Addressed This Visit      Cardiovascular and Mediastinum   Mesenteric vein thrombosis - Primary     Other   Attention and concentration deficit   Relevant Medications   amphetamine-dextroamphetamine (ADDERALL XR) 30 MG 24 hr capsule   amphetamine-dextroamphetamine (ADDERALL XR) 30 MG 24 hr capsule   amphetamine-dextroamphetamine (ADDERALL XR) 30 MG 24 hr capsule   Tobacco use   Anemia   Relevant Orders   Comprehensive metabolic panel   CBC   Vitamin B12   Iron, TIBC and Ferritin Panel      Meds ordered this encounter  Medications  . amphetamine-dextroamphetamine (ADDERALL XR) 30 MG 24 hr capsule    Sig: Take 1 capsule (30 mg total) by mouth as needed.    Dispense:  30 capsule    Refill:  0    Fill now  . amphetamine-dextroamphetamine (ADDERALL XR) 30 MG 24 hr capsule    Sig: Take 1 capsule (30 mg total) by mouth every morning.    Dispense:  30 capsule    Refill:  0    Fill in 30 days.  Marland Kitchen amphetamine-dextroamphetamine (ADDERALL XR) 30 MG 24 hr capsule    Sig: Take 1 capsule (30 mg total) by mouth every morning.    Dispense:  30 capsule    Refill:  0    Fill in 60 days    Follow-up: Return in about 3 months (around 01/25/2019).   Virtual Visit via Video Note  I connected with Damiean Lukes on 10/25/18 at 11:00 AM EDT by a video enabled telemedicine application and verified that I am  speaking with the correct person using two identifiers.  Location: Patient: home  Provider:    I discussed the limitations of evaluation and management by telemedicine and the availability of in person appointments. The patient expressed understanding and agreed to proceed.  History of Present Illness:    Observations/Objective:   Assessment and Plan:   Follow Up Instructions:    I discussed the assessment and treatment plan with the patient. The patient was provided an opportunity to ask questions and all were answered. The patient agreed with the plan and demonstrated an understanding of the instructions.   The patient was advised to call back or seek an in-person evaluation if the symptoms worsen or if the condition fails to improve as anticipated.  I provided 21 minutes of non-face-to-face time during this encounter.   Libby Maw, MD  Reminded patient that tobacco use places 1 in a hypercoagulable state.  Asked him again to consider Chantix and let him know that I would be happy to send that to the pharmacy for him.

## 2018-10-27 ENCOUNTER — Ambulatory Visit: Payer: BLUE CROSS/BLUE SHIELD | Admitting: Family Medicine

## 2018-11-01 NOTE — Addendum Note (Signed)
Addended by: Nathanial Millman E on: 11/01/2018 10:00 AM   Modules accepted: Orders

## 2018-11-17 ENCOUNTER — Other Ambulatory Visit: Payer: Self-pay | Admitting: Family Medicine

## 2018-11-17 DIAGNOSIS — R41840 Attention and concentration deficit: Secondary | ICD-10-CM

## 2018-11-17 NOTE — Telephone Encounter (Signed)
Rx pended for provider. Pt is switching to [facility] at [facility] & rx can't be transferred.

## 2018-11-17 NOTE — Telephone Encounter (Signed)
Patient does understand that refill is not till next Wednesday, patient wanted to make sure with switching pharmacies he can get the medication

## 2018-11-17 NOTE — Telephone Encounter (Signed)
REFILL amphetamine-dextroamphetamine (ADDERALL XR) 30 MG 24 hr capsule  PHARMACY [address], [place], [place] [address] [phone]

## 2018-11-18 NOTE — Telephone Encounter (Signed)
Rx's at [facility] have been canceled.

## 2018-11-20 MED ORDER — AMPHETAMINE-DEXTROAMPHET ER 30 MG PO CP24: 30.0000 mg | ORAL_CAPSULE | ORAL | 0 refills | Status: DC

## 2018-12-20 ENCOUNTER — Other Ambulatory Visit: Payer: Self-pay | Admitting: Family Medicine

## 2018-12-20 ENCOUNTER — Telehealth: Payer: Self-pay | Admitting: Family Medicine

## 2018-12-20 DIAGNOSIS — R41840 Attention and concentration deficit: Secondary | ICD-10-CM

## 2018-12-20 MED ORDER — AMPHETAMINE-DEXTROAMPHET ER 30 MG PO CP24: 30.0000 mg | ORAL_CAPSULE | ORAL | 0 refills | Status: DC

## 2018-12-20 NOTE — Telephone Encounter (Signed)
Medication Refill - Medication: amphetamine-dextroamphetamine (ADDERALL XR) 30 MG 24 hr capsule    Has the patient contacted their pharmacy? No. (Agent: If no, request that the patient contact the pharmacy for the refill.) (Agent: If yes, when and what did the pharmacy advise?)  Preferred Pharmacy (with phone number or street name):  [id number] - [place], [place]  [place] [address]  Phone: [phone] Fax: [phone]  Not a 24 hour pharmacy; exact hours not known.     Agent: Please be advised that RX refills may take up to 3 business days. We ask that you follow-up with your pharmacy.

## 2018-12-20 NOTE — Telephone Encounter (Signed)
Dr. Zigmund Daniel please advise on the absence of Dr. Ethelene Hal last refill 7/04 #30 0 refills. Last OV 6/8/ No upcoming OV.

## 2018-12-20 NOTE — Telephone Encounter (Signed)
Previous notes and PDMP reviewed.  Refill ordered.

## 2018-12-20 NOTE — Telephone Encounter (Signed)
Pt aware of rx sent in.

## 2019-01-18 ENCOUNTER — Ambulatory Visit: Payer: BC Managed Care – PPO | Admitting: Family Medicine

## 2019-01-18 ENCOUNTER — Other Ambulatory Visit: Payer: Self-pay

## 2019-01-18 ENCOUNTER — Encounter: Payer: Self-pay | Admitting: Family Medicine

## 2019-01-18 VITALS — BP 140/90 | HR 89 | Wt 193.0 lb

## 2019-01-18 DIAGNOSIS — R030 Elevated blood-pressure reading, without diagnosis of hypertension: Secondary | ICD-10-CM

## 2019-01-18 DIAGNOSIS — E611 Iron deficiency: Secondary | ICD-10-CM

## 2019-01-18 DIAGNOSIS — Z5181 Encounter for therapeutic drug level monitoring: Secondary | ICD-10-CM | POA: Diagnosis not present

## 2019-01-18 DIAGNOSIS — K219 Gastro-esophageal reflux disease without esophagitis: Secondary | ICD-10-CM

## 2019-01-18 DIAGNOSIS — F4320 Adjustment disorder, unspecified: Secondary | ICD-10-CM | POA: Diagnosis not present

## 2019-01-18 DIAGNOSIS — E875 Hyperkalemia: Secondary | ICD-10-CM | POA: Diagnosis not present

## 2019-01-18 DIAGNOSIS — R41840 Attention and concentration deficit: Secondary | ICD-10-CM

## 2019-01-18 LAB — URINALYSIS, ROUTINE W REFLEX MICROSCOPIC
Bilirubin Urine: NEGATIVE
Hgb urine dipstick: NEGATIVE
Ketones, ur: NEGATIVE
Leukocytes,Ua: NEGATIVE
Nitrite: NEGATIVE
RBC / HPF: NONE SEEN (ref 0–?)
Specific Gravity, Urine: 1.02 (ref 1.000–1.030)
Total Protein, Urine: NEGATIVE
Urine Glucose: NEGATIVE
Urobilinogen, UA: 0.2 (ref 0.0–1.0)
pH: 6.5 (ref 5.0–8.0)

## 2019-01-18 LAB — BASIC METABOLIC PANEL
BUN: 14 mg/dL (ref 6–23)
CO2: 27 mEq/L (ref 19–32)
Calcium: 9.2 mg/dL (ref 8.4–10.5)
Chloride: 106 mEq/L (ref 96–112)
Creatinine, Ser: 1.1 mg/dL (ref 0.40–1.50)
GFR: 69.63 mL/min (ref >60.00)
Glucose, Bld: 102 mg/dL — ABNORMAL HIGH (ref 70–99)
Potassium: 4.3 mEq/L (ref 3.5–5.1)
Sodium: 140 mEq/L (ref 135–145)

## 2019-01-18 LAB — CBC
HCT: 48.1 % (ref 39.0–52.0)
Hemoglobin: 15.8 g/dL (ref 13.0–17.0)
MCHC: 32.9 g/dL (ref 30.0–36.0)
MCV: 86.9 fl (ref 78.0–100.0)
Platelets: 276 10*3/uL (ref 150.0–400.0)
RBC: 5.53 Mil/uL (ref 4.22–5.81)
RDW: 14.4 % (ref 11.5–15.5)
WBC: 8.7 10*3/uL (ref 4.0–10.5)

## 2019-01-18 MED ORDER — AMPHETAMINE-DEXTROAMPHET ER 30 MG PO CP24: 30.0000 mg | ORAL_CAPSULE | ORAL | 0 refills | Status: DC

## 2019-01-18 MED ORDER — PANTOPRAZOLE SODIUM 40 MG PO TBEC: 40.0000 mg | DELAYED_RELEASE_TABLET | Freq: Every day | ORAL | 1 refills | Status: DC

## 2019-01-18 MED ORDER — AMPHETAMINE-DEXTROAMPHET ER 30 MG PO CP24: 30.0000 mg | ORAL_CAPSULE | ORAL | 0 refills | Status: AC

## 2019-01-18 NOTE — Progress Notes (Signed)
Established Patient Office Visit  Subjective:  Patient ID: Cameron Peters, male    DOB: 03/15/1966  Age: 53 y.o. MRN: [id number]  CC:  Chief Complaint  Patient presents with  . Follow-up    HPI Cameron Peters presents for continues to take Adderall on a daily basis with good response.  Work continues to be stressful.  Medication helps with concentration.  He is not traveling at this time.  He comes into the office on Mondays only.  There is family stress at this time.  His wife and children left after they discovered his ongoing affair.  He is started smoking a pack of cigarettes daily.  He is currently not exercising.  Was unable to see this to the dentist this year but plans on making an appointment soon.  He has an upper plate.  He is no longer taking his iron pill consistently.  Denies any blood or melena.  He uses Protonix as needed for indigestion.  His breathing is actually improved because the family cat left with his wife and children.  He has not had to use his inhaler.  Past Medical History:  Diagnosis Date  . Asthma   . Colitis   . 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 BIOPSY  04/07/2018   Procedure: BIOPSY;  Surgeon: Willis Modena, MD;  Location: [facility] ENDOSCOPY;  Service: Endoscopy;;  . CHOLECYSTECTOMY    . COLONOSCOPY WITH PROPOFOL Left 04/07/2018   Procedure: COLONOSCOPY WITH PROPOFOL;  Surgeon: Willis Modena, MD;  Location: [facility] ENDOSCOPY;  Service: Endoscopy;  Laterality: Left;  . ESOPHAGOGASTRODUODENOSCOPY (EGD) WITH PROPOFOL Left 04/07/2018   Procedure: ESOPHAGOGASTRODUODENOSCOPY (EGD) WITH PROPOFOL;  Surgeon: Willis Modena, MD;  Location: [facility] ENDOSCOPY;  Service: Endoscopy;  Laterality: Left;  . SHOULDER SURGERY      History reviewed. No pertinent family history.  Social History   Socioeconomic History  . Marital status: Married    Spouse name: Not on file  . Number of children:  Not on file  . Years of education: Not on file  . Highest education level: Not on file  Occupational History  . Not on file  Social Needs  . Financial resource strain: Not on file  . Food insecurity    Worry: Not on file    Inability: Not on file  . Transportation needs    Medical: Not on file    Non-medical: Not on file  Tobacco Use  . Smoking status: Current Every Day Smoker    Packs/day: 1.00    Years: 25.00    Pack years: 25.00    Types: Cigarettes  . Smokeless tobacco: Never Used  Substance and Sexual Activity  . Alcohol use: No  . Drug use: No  . Sexual activity: Not on file  Lifestyle  . Physical activity    Days per week: Not on file    Minutes per session: Not on file  . Stress: Not on file  Relationships  . Social Musician on phone: Not on file    Gets together: Not on file    Attends religious service: Not on file    Active member of club or organization: Not on file    Attends meetings of clubs or organizations: Not on file    Relationship status: Not on file  . Intimate partner violence    Fear of current or ex partner: Not on file    Emotionally  abused: Not on file    Physically abused: Not on file    Forced sexual activity: Not on file  Other Topics Concern  . Not on file  Social History Narrative  . Not on file    Outpatient Medications Prior to Visit  Medication Sig Dispense Refill  . Albuterol Sulfate (PROAIR RESPICLICK) 108 (90 Base) MCG/ACT AEPB Inhale 1 puff into the lungs 4 (four) times daily as needed. 1 each 1  . b complex vitamins capsule Take 1 capsule by mouth daily.    . ferrous sulfate 325 (65 FE) MG EC tablet Take 1 tablet (325 mg total) by mouth 2 (two) times daily. 60 tablet 0  . Omega-3 1000 MG CAPS Take 1,000 mg by mouth daily.    Marland Kitchen. omega-3 acid ethyl esters (LOVAZA) 1 g capsule Take 1 g by mouth daily.    . pantoprazole (PROTONIX) 40 MG tablet Take 1 tablet (40 mg total) by mouth daily. (Patient taking differently:  Take 40 mg by mouth daily. prn) 30 tablet 0  . Probiotic Product (PROBIOTIC DAILY PO) Take 20 mg by mouth daily. 4 strain    . amphetamine-dextroamphetamine (ADDERALL XR) 30 MG 24 hr capsule Take 1 capsule (30 mg total) by mouth every morning. 30 capsule 0  . rivaroxaban (XARELTO) 20 MG TABS tablet Take 1 tablet (20 mg total) by mouth daily with supper. (Patient not taking: Reported on 01/18/2019) 90 tablet 1   No facility-administered medications prior to visit.     Allergies  Allergen Reactions  . Penicillins Other (See Comments)    Childhood reaction - was told by parent a long time ago    ROS Review of Systems  Constitutional: Negative.   HENT: Negative.   Eyes: Negative for photophobia and visual disturbance.  Respiratory: Negative for chest tightness, shortness of breath and wheezing.   Cardiovascular: Negative.   Gastrointestinal: Negative.  Negative for anal bleeding and blood in stool.  Endocrine: Negative for polyphagia and polyuria.  Genitourinary: Negative for hematuria.  Musculoskeletal: Negative for gait problem and joint swelling.  Skin: Negative for pallor and rash.  Allergic/Immunologic: Negative for immunocompromised state.  Neurological: Negative for headaches.  Hematological: Does not bruise/bleed easily.  Psychiatric/Behavioral: The patient is nervous/anxious.       Objective:    Physical Exam  Constitutional: He is oriented to person, place, and time. He appears well-developed and well-nourished. No distress.  HENT:  Head: Normocephalic and atraumatic.  Right Ear: External ear normal.  Left Ear: External ear normal.  Mouth/Throat: Oropharynx is clear and moist. No oropharyngeal exudate.  Eyes: Pupils are equal, round, and reactive to light. Conjunctivae are normal. Right eye exhibits no discharge. Left eye exhibits no discharge. No scleral icterus.  Neck: No JVD present. No tracheal deviation present. No thyromegaly present.  Cardiovascular: Normal  rate, regular rhythm and normal heart sounds.  Pulmonary/Chest: Effort normal and breath sounds normal. No stridor.  Abdominal: Bowel sounds are normal.  Musculoskeletal:        General: No edema.  Lymphadenopathy:    He has no cervical adenopathy.  Neurological: He is alert and oriented to person, place, and time.  Skin: Skin is warm and dry. He is not diaphoretic.  Psychiatric: He has a normal mood and affect. His behavior is normal.    BP 140/90   Pulse 89   Wt 193 lb (87.5 kg)   SpO2 97%   BMI 28.50 kg/m  Wt Readings from Last 3 Encounters:  01/18/19  193 lb (87.5 kg)  07/27/18 192 lb (87.1 kg)  04/27/18 186 lb 4 oz (84.5 kg)   BP Readings from Last 3 Encounters:  01/18/19 140/90  07/27/18 126/80  04/27/18 128/80   Guideline developer:  UpToDate (see UpToDate for funding source) Date Released: June 2014  Health Maintenance Due  Topic Date Due  . Samul Dada  07/22/1983  . INFLUENZA VACCINE  12/18/2018    There are no preventive care reminders to display for this patient.  Lab Results  Component Value Date   TSH 2.11 08/07/2017   Lab Results  Component Value Date   WBC 8.1 07/27/2018   HGB 15.2 07/27/2018   HCT 47.5 07/27/2018   MCV 79.6 07/27/2018   PLT 351.0 07/27/2018   Lab Results  Component Value Date   NA 139 04/09/2018   K 3.8 04/09/2018   CO2 23 04/09/2018   GLUCOSE 108 (H) 04/09/2018   BUN 10 04/09/2018   CREATININE 1.02 04/09/2018   BILITOT 0.5 04/07/2018   ALKPHOS 205 (H) 04/07/2018   AST 75 (H) 04/07/2018   ALT 88 (H) 04/07/2018   PROT 6.1 (L) 04/07/2018   ALBUMIN 2.6 (L) 04/07/2018   CALCIUM 8.5 (L) 04/09/2018   ANIONGAP 6 04/09/2018   GFR 70.69 08/07/2017   Lab Results  Component Value Date   CHOL 122 04/03/2018   Lab Results  Component Value Date   HDL 15 (L) 04/03/2018   Lab Results  Component Value Date   LDLCALC 84 04/03/2018   Lab Results  Component Value Date   TRIG 115 04/03/2018   Lab Results  Component  Value Date   CHOLHDL 8.1 04/03/2018   No results found for: HGBA1C    Assessment & Plan:   Problem List Items Addressed This Visit      Other   Medication monitoring encounter   Relevant Orders   Urine drugs of abuse scrn w alc, routine ([facility])   Attention and concentration deficit   Relevant Medications   amphetamine-dextroamphetamine (ADDERALL XR) 30 MG 24 hr capsule   Serum potassium elevated   Relevant Orders   Basic metabolic panel   Iron deficiency   Relevant Orders   CBC   Iron, TIBC and Ferritin Panel   Elevated BP without diagnosis of hypertension   Relevant Orders   Basic metabolic panel   Urinalysis, Routine w reflex microscopic   Adjustment disorder - Primary      Meds ordered this encounter  Medications  . DISCONTD: amphetamine-dextroamphetamine (ADDERALL XR) 30 MG 24 hr capsule    Sig: Take 1 capsule (30 mg total) by mouth every morning.    Dispense:  30 capsule    Refill:  0  . DISCONTD: amphetamine-dextroamphetamine (ADDERALL XR) 30 MG 24 hr capsule    Sig: Take 1 capsule (30 mg total) by mouth every morning.    Dispense:  30 capsule    Refill:  0    Fill in 30 days.  Marland Kitchen amphetamine-dextroamphetamine (ADDERALL XR) 30 MG 24 hr capsule    Sig: Take 1 capsule (30 mg total) by mouth every morning.    Dispense:  30 capsule    Refill:  0    Fill in 60 days.    Follow-up: Return in about 3 months (around 04/19/2019), or Please stop smoking!.   Patient is overdue for a physical and advised him to return specifically for that.  He said that he would.

## 2019-01-18 NOTE — Addendum Note (Signed)
Addended by: Nathanial Millman E on: 01/18/2019 10:14 AM   Modules accepted: Orders

## 2019-01-19 LAB — URINE DRUGS OF ABUSE SCREEN W ALC, ROUTINE (REF LAB)
Amphetamines, Urine: NEGATIVE ng/mL
Barbiturate Quant, Ur: NEGATIVE ng/mL
Benzodiazepine Quant, Ur: NEGATIVE ng/mL
Cannabinoid Quant, Ur: NEGATIVE ng/mL
Cocaine (Metab.): NEGATIVE ng/mL
Ethanol, Urine: NEGATIVE %
Methadone Screen, Urine: NEGATIVE ng/mL
Opiate Quant, Ur: NEGATIVE ng/mL
PCP Quant, Ur: NEGATIVE ng/mL
Propoxyphene: NEGATIVE ng/mL

## 2019-01-19 LAB — IRON,TIBC AND FERRITIN PANEL
%SAT: 16 % (calc) — ABNORMAL LOW (ref 20–48)
Ferritin: 37 ng/mL — ABNORMAL LOW (ref 38–380)
Iron: 54 ug/dL (ref 50–180)
TIBC: 340 mcg/dL (calc) (ref 250–425)

## 2019-07-17 ENCOUNTER — Other Ambulatory Visit: Payer: Self-pay | Admitting: Family Medicine

## 2019-07-17 DIAGNOSIS — K219 Gastro-esophageal reflux disease without esophagitis: Secondary | ICD-10-CM

## 2020-02-24 DIAGNOSIS — R059 Cough, unspecified: Secondary | ICD-10-CM | POA: Diagnosis not present

## 2020-02-24 DIAGNOSIS — Z20822 Contact with and (suspected) exposure to covid-19: Secondary | ICD-10-CM | POA: Diagnosis not present

## 2020-02-24 DIAGNOSIS — R0602 Shortness of breath: Secondary | ICD-10-CM | POA: Diagnosis not present

## 2020-02-24 DIAGNOSIS — J45901 Unspecified asthma with (acute) exacerbation: Secondary | ICD-10-CM | POA: Diagnosis not present

## 2020-05-13 DIAGNOSIS — R0602 Shortness of breath: Secondary | ICD-10-CM | POA: Diagnosis not present

## 2020-05-13 DIAGNOSIS — I10 Essential (primary) hypertension: Secondary | ICD-10-CM | POA: Diagnosis not present

## 2020-05-13 DIAGNOSIS — Z20822 Contact with and (suspected) exposure to covid-19: Secondary | ICD-10-CM | POA: Diagnosis not present

## 2020-05-13 DIAGNOSIS — J45901 Unspecified asthma with (acute) exacerbation: Secondary | ICD-10-CM | POA: Diagnosis not present

## 2020-05-13 DIAGNOSIS — F17210 Nicotine dependence, cigarettes, uncomplicated: Secondary | ICD-10-CM | POA: Diagnosis not present

## 2020-05-13 DIAGNOSIS — R069 Unspecified abnormalities of breathing: Secondary | ICD-10-CM | POA: Diagnosis not present

## 2020-05-13 DIAGNOSIS — R059 Cough, unspecified: Secondary | ICD-10-CM | POA: Diagnosis not present

## 2020-05-15 DIAGNOSIS — R079 Chest pain, unspecified: Secondary | ICD-10-CM | POA: Diagnosis not present

## 2020-05-15 DIAGNOSIS — R0602 Shortness of breath: Secondary | ICD-10-CM | POA: Diagnosis not present

## 2020-10-14 IMAGING — DX DG CHEST 2V
2 series · 2 of 2 positions shown · non-contrast
Comparison: Chest x-ray dated August 29, 2011.

CLINICAL DATA: Shortness of breath.  Recent flu.

EXAM:
CHEST - 2 VIEW

[chest pa]
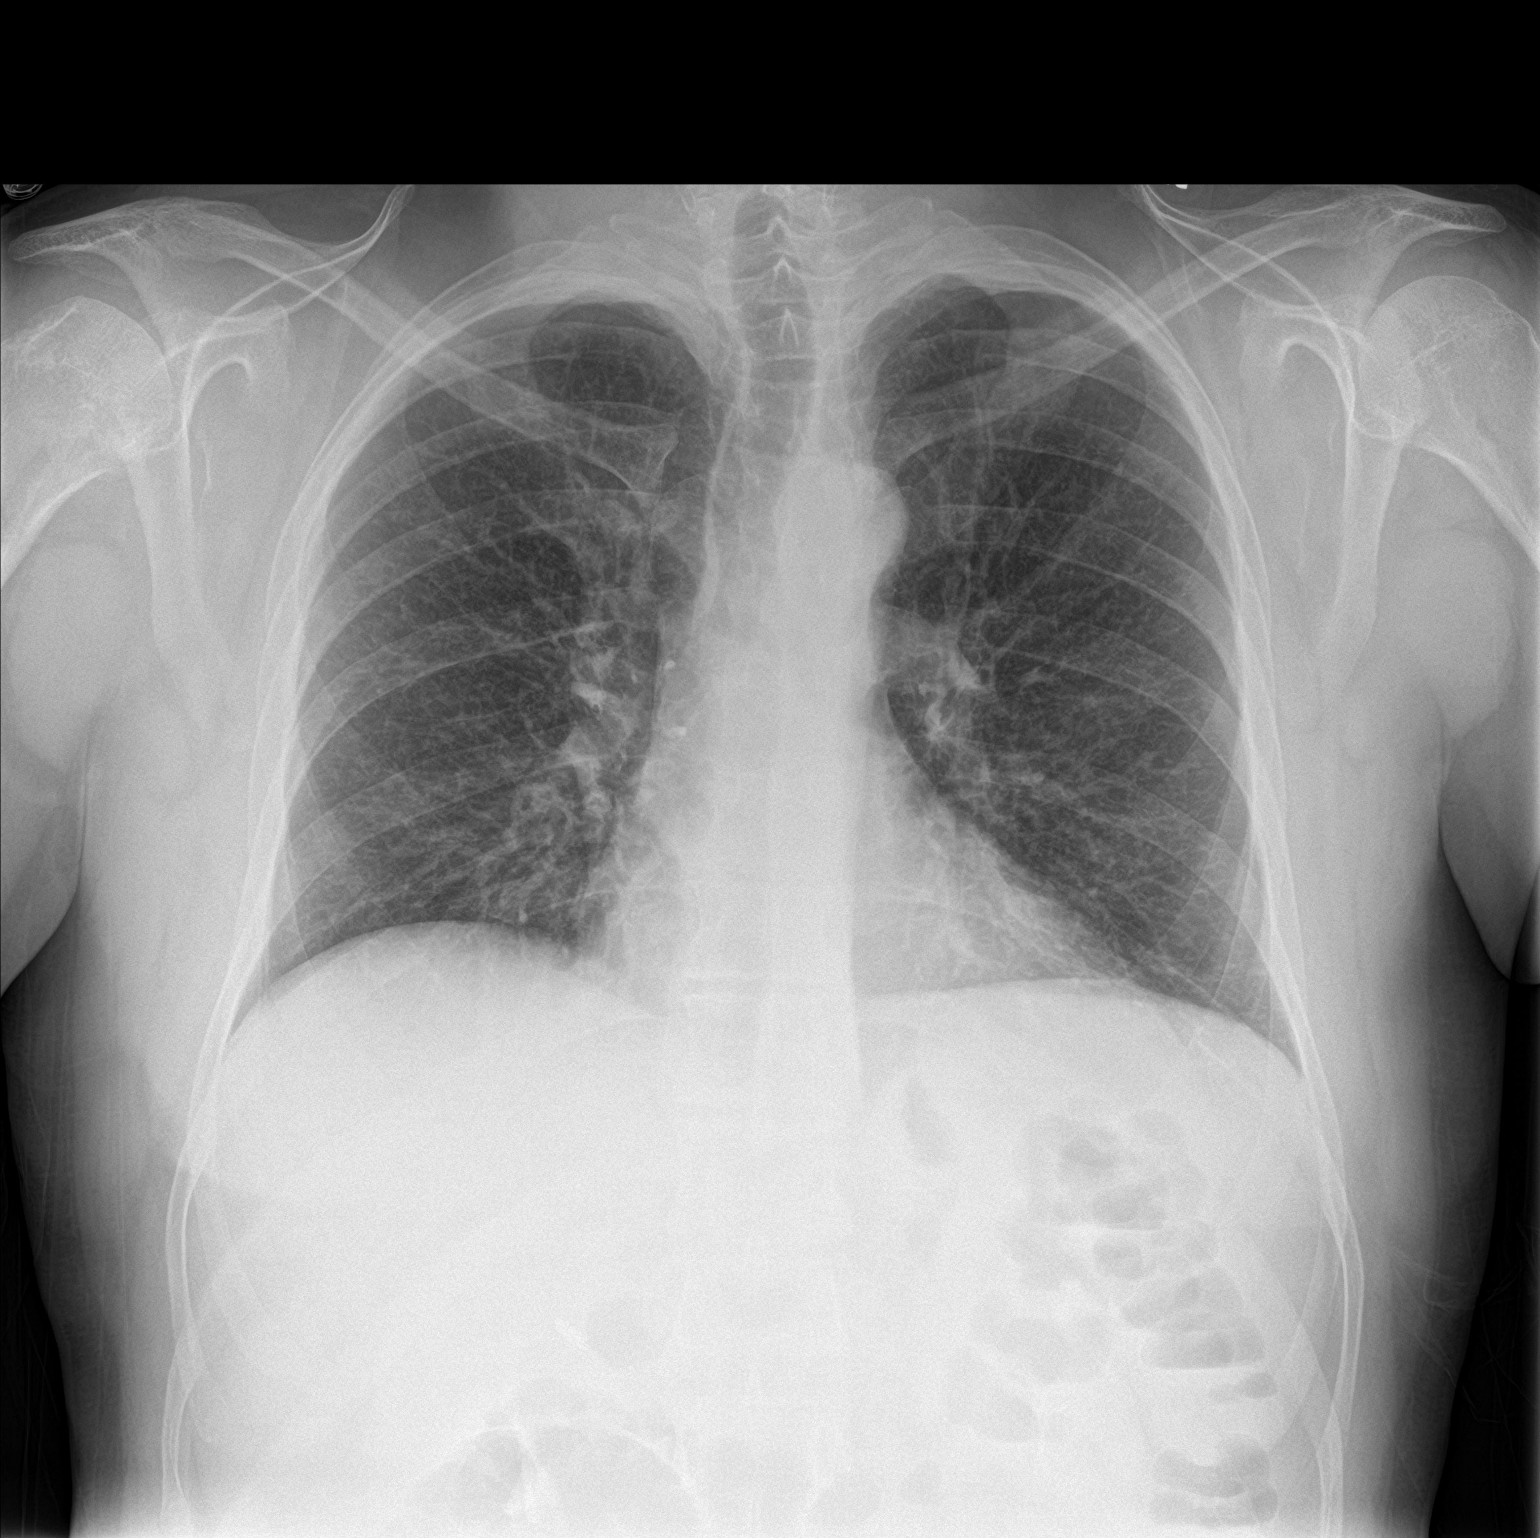

[chest lat]
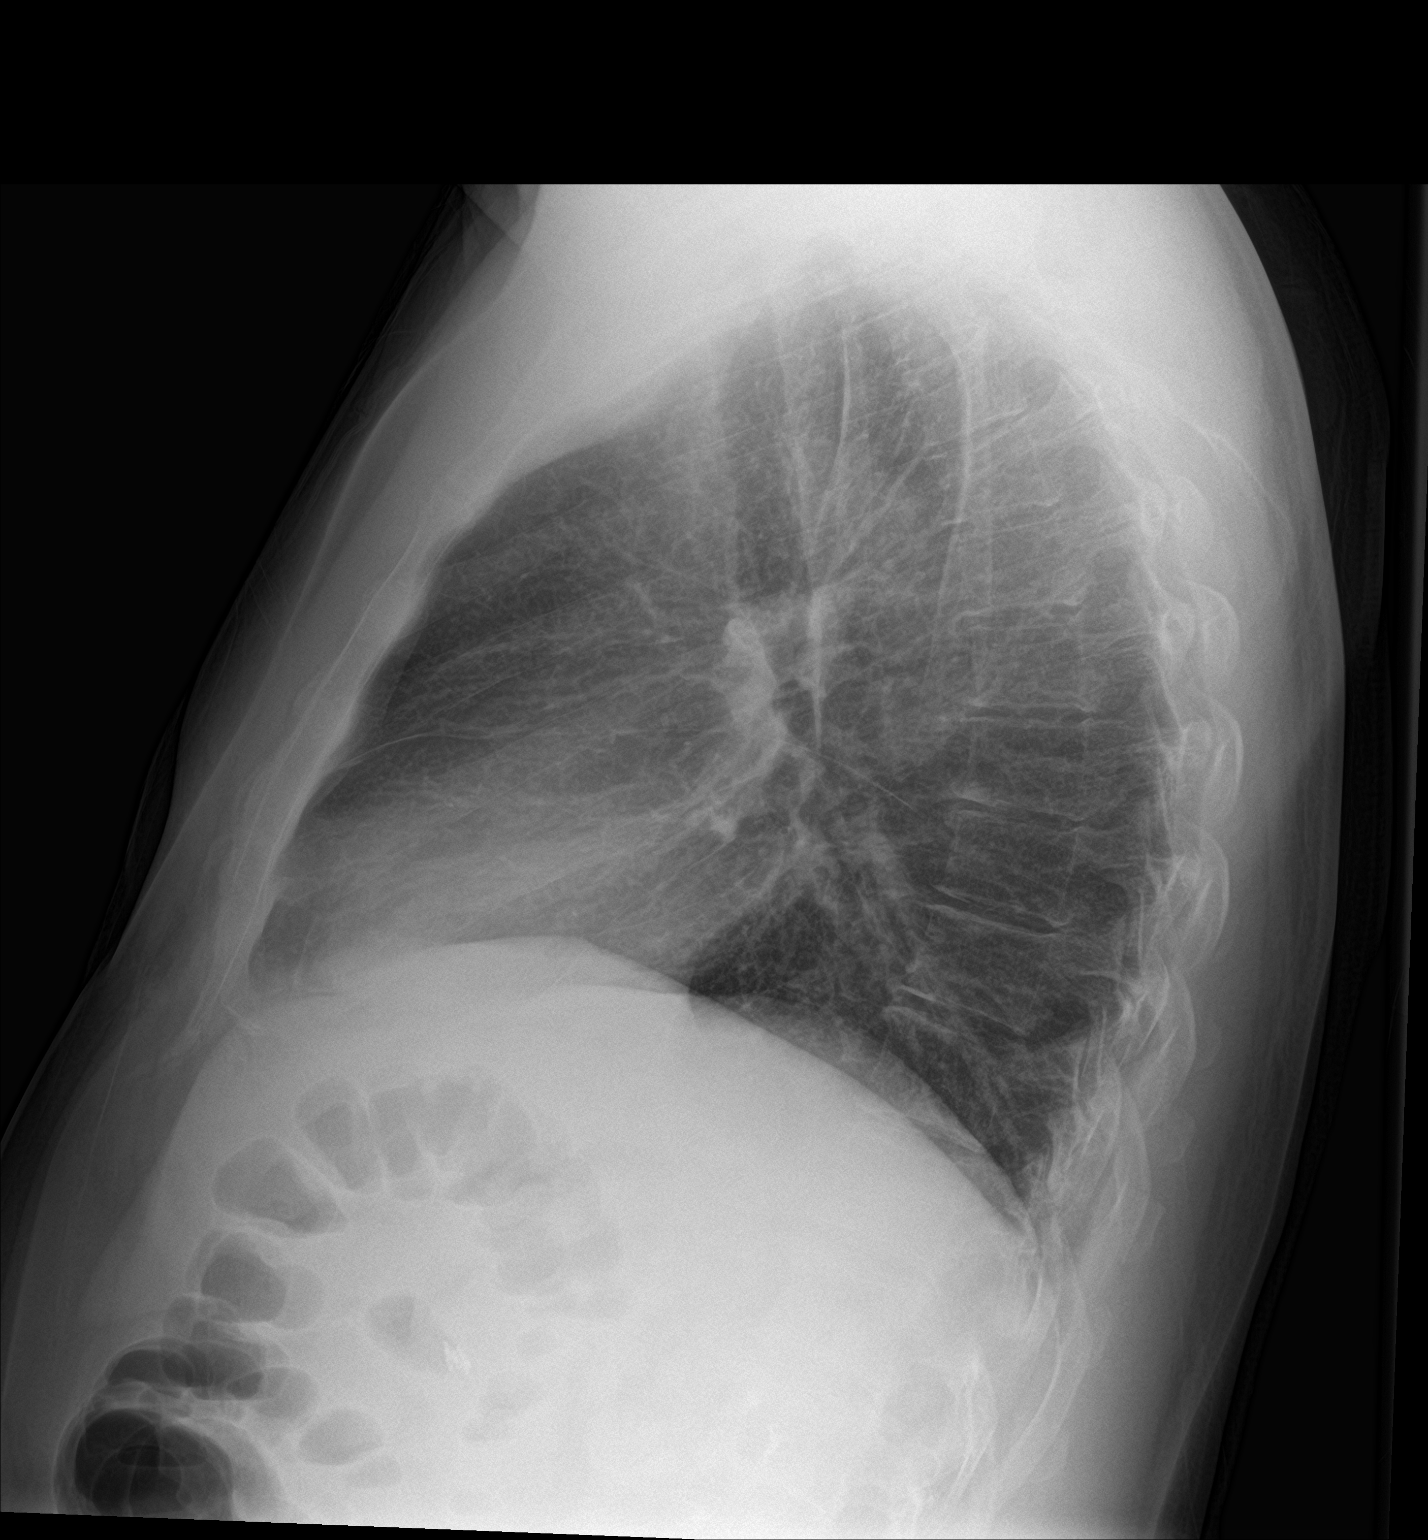

[2 of 2 positions shown; findings below may reference images not displayed]

FINDINGS: The heart size and mediastinal contours are within normal limits.
Normal pulmonary vascularity. Mildly coarsened interstitial markings
are likely smoking related. No focal consolidation, pleural
effusion, or pneumothorax. No acute osseous abnormality.
IMPRESSION: No active cardiopulmonary disease.

## 2020-10-17 IMAGING — DX DG ABD PORTABLE 2V
2 series · 2 of 2 positions shown · non-contrast
Comparison: CT abdomen pelvis of 04/03/2018

CLINICAL DATA: Increasing left abdominal pain today, history of
superior mesenteric vein thrombosis and colitis

EXAM:
PORTABLE ABDOMEN - 2 VIEW

[abdomen supine (1 of 2)]
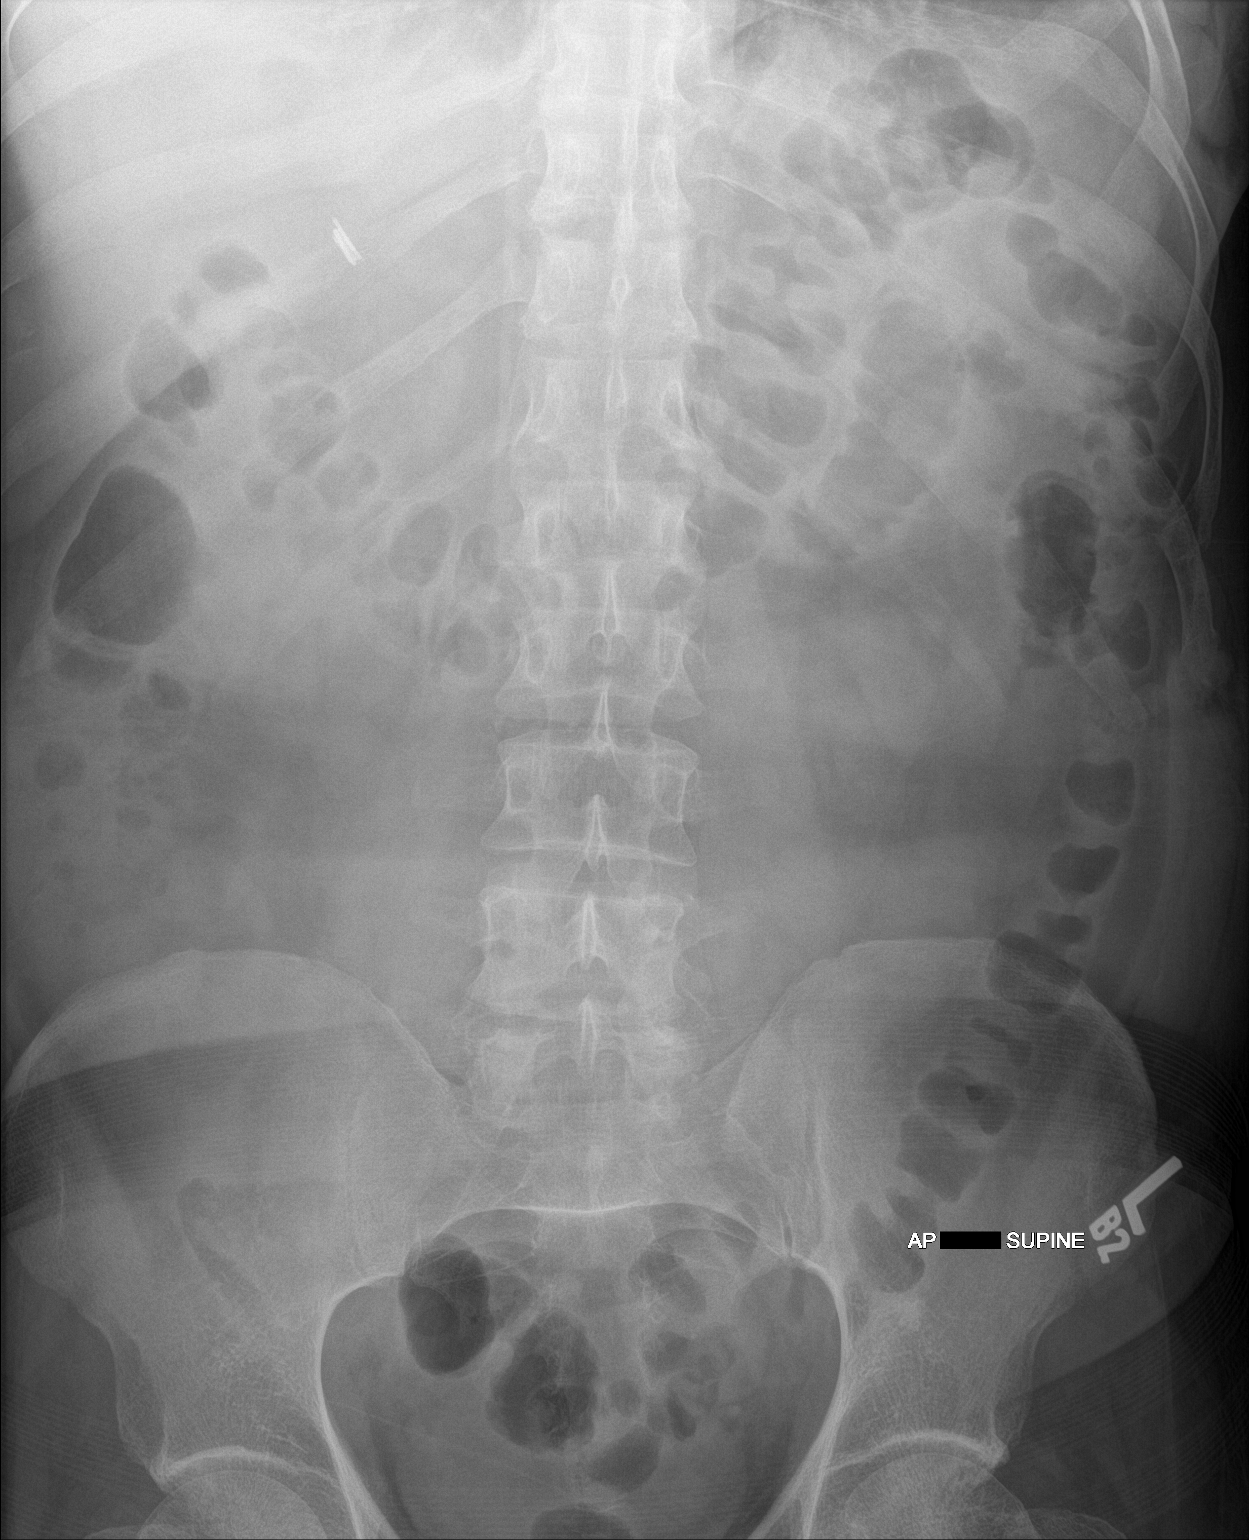

[abdomen supine (2 of 2)]
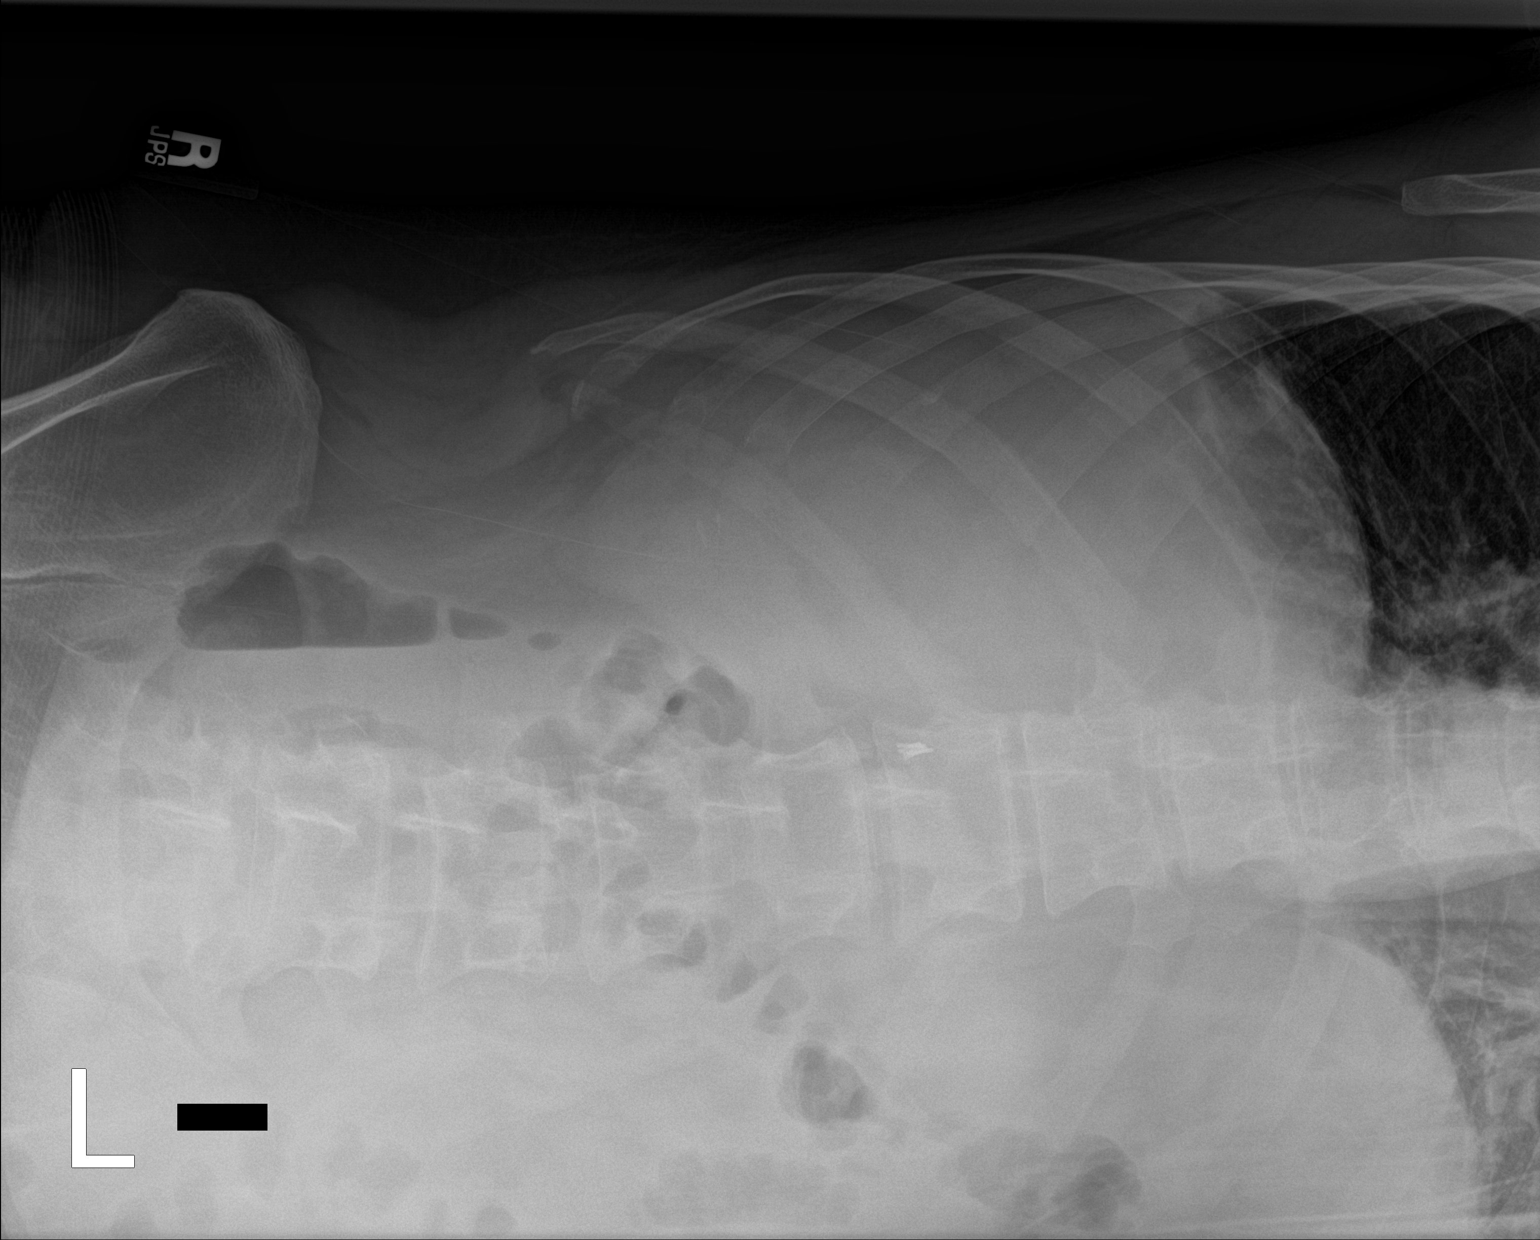

[2 of 2 positions shown; findings below may reference images not displayed]

FINDINGS: Supine and left lateral decubitus films of the abdomen show no bowel
obstruction. No free intraperitoneal air is seen. Surgical clips are
noted in the right upper quadrant from prior cholecystectomy. No
renal calculi are noted. The bones are unremarkable.
IMPRESSION: No bowel obstruction.  No free air.  No opaque calculi.
